# Patient Record
Sex: Female | Born: 1990 | Race: White | Hispanic: No | Marital: Married | Smoking: Current every day smoker
Health system: Southern US, Community
[De-identification: ages and names within clinical notes are randomized; demographics above are authoritative.]

## PROBLEM LIST (undated history)

## (undated) ENCOUNTER — Inpatient Hospital Stay (HOSPITAL_COMMUNITY): Payer: Self-pay

## (undated) DIAGNOSIS — Z789 Other specified health status: Secondary | ICD-10-CM

## (undated) HISTORY — PX: NO PAST SURGERIES: SHX2092

---
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
## 2011-04-06 LAB — HEPATIC FUNCTION PANEL
AST: 24
Bilirubin, Direct: 0.1
Total Bilirubin: 0.8

## 2011-04-06 LAB — PREGNANCY, URINE: Preg Test, Ur: NEGATIVE

## 2011-04-06 LAB — BENZODIAZEPINE, QUANTITATIVE, URINE
Alprazolam (GC/LC/MS), ur confirm: NEGATIVE
Flurazepam GC/MS Conf: NEGATIVE
Nordiazepam GC/MS Conf: NEGATIVE
Oxazepam GC/MS Conf: 170 ng/mL

## 2011-04-06 LAB — BASIC METABOLIC PANEL
BUN: 7
CO2: 24
Chloride: 103
Creatinine, Ser: 0.84

## 2011-04-06 LAB — DRUGS OF ABUSE SCREEN W/O ALC, ROUTINE URINE
Barbiturate Quant, Ur: NEGATIVE
Cocaine Metabolites: NEGATIVE
Opiate Screen, Urine: NEGATIVE
Phencyclidine (PCP): NEGATIVE
Propoxyphene: NEGATIVE

## 2011-04-06 LAB — URINALYSIS, ROUTINE W REFLEX MICROSCOPIC
Bilirubin Urine: NEGATIVE
Ketones, ur: NEGATIVE
Nitrite: NEGATIVE
Protein, ur: NEGATIVE
pH: 7

## 2011-04-06 LAB — SALICYLATE LEVEL: Salicylate Lvl: <4

## 2011-04-06 LAB — ETHANOL: Alcohol, Ethyl (B): <5

## 2011-04-07 LAB — HEPATIC FUNCTION PANEL
ALT: 16
AST: 25
Albumin: 5
Alkaline Phosphatase: 106
Indirect Bilirubin: 1 — ABNORMAL HIGH
Total Protein: 8.4 — ABNORMAL HIGH

## 2011-04-07 LAB — DIFFERENTIAL
Basophils Relative: 1
Lymphs Abs: 3.2
Monocytes Absolute: 0.6
Monocytes Relative: 6
Neutro Abs: 5.9

## 2011-04-07 LAB — CBC
Hemoglobin: 15.6 — ABNORMAL HIGH
MCHC: 35 — ABNORMAL HIGH
RBC: 5.09
WBC: 9.7

## 2011-04-07 LAB — URINE MICROSCOPIC-ADD ON

## 2011-04-07 LAB — DRUGS OF ABUSE SCREEN W/O ALC, ROUTINE URINE
Cocaine Metabolites: NEGATIVE
Phencyclidine (PCP): NEGATIVE
Propoxyphene: NEGATIVE

## 2011-04-07 LAB — BASIC METABOLIC PANEL
CO2: 27
Calcium: 10.1
Chloride: 98
Sodium: 136

## 2011-04-07 LAB — RPR: RPR Ser Ql: NONREACTIVE

## 2011-04-07 LAB — BENZODIAZEPINE, QUANTITATIVE, URINE
Alprazolam (GC/LC/MS), ur confirm: NEGATIVE
Nordiazepam GC/MS Conf: 550 ng/mL
Oxazepam GC/MS Conf: 1900 ng/mL

## 2011-04-07 LAB — URINALYSIS, ROUTINE W REFLEX MICROSCOPIC
Bilirubin Urine: NEGATIVE
Ketones, ur: NEGATIVE
Nitrite: NEGATIVE
Urobilinogen, UA: 0.2

## 2011-04-07 LAB — TSH: TSH: 1.5

## 2011-04-07 LAB — PREGNANCY, URINE: Preg Test, Ur: NEGATIVE

## 2016-02-20 ENCOUNTER — Ambulatory Visit (INDEPENDENT_AMBULATORY_CARE_PROVIDER_SITE_OTHER): Payer: BLUE CROSS/BLUE SHIELD | Admitting: Pediatrics

## 2016-02-20 ENCOUNTER — Encounter: Payer: Self-pay | Admitting: Pediatrics

## 2016-02-20 VITALS — BP 118/75 | HR 85 | Temp 97.5°F | Ht 66.0 in | Wt 151.0 lb

## 2016-02-20 DIAGNOSIS — Z0000 Encounter for general adult medical examination without abnormal findings: Secondary | ICD-10-CM | POA: Diagnosis not present

## 2016-02-20 DIAGNOSIS — Z720 Tobacco use: Secondary | ICD-10-CM

## 2016-02-20 DIAGNOSIS — O0000 Abdominal pregnancy without intrauterine pregnancy: Secondary | ICD-10-CM

## 2016-02-20 NOTE — Patient Instructions (Addendum)
Smoking Cessation, Tips for Success If you are ready to quit smoking, congratulations! You have chosen to help yourself be healthier. Cigarettes bring nicotine, tar, carbon monoxide, and other irritants into your body. Your lungs, heart, and blood vessels will be able to work better without these poisons. There are many different ways to quit smoking. Nicotine gum, nicotine patches, a nicotine inhaler, or nicotine nasal spray can help with physical craving. Hypnosis, support groups, and medicines help break the habit of smoking. WHAT THINGS CAN I DO TO MAKE QUITTING EASIER?  Here are some tips to help you quit for good:  Pick a date when you will quit smoking completely. Tell all of your friends and family about your plan to quit on that date.  Do not try to slowly cut down on the number of cigarettes you are smoking. Pick a quit date and quit smoking completely starting on that day.  Throw away all cigarettes.   Clean and remove all ashtrays from your home, work, and car.  On a card, write down your reasons for quitting. Carry the card with you and read it when you get the urge to smoke.  Cleanse your body of nicotine. Drink enough water and fluids to keep your urine clear or pale yellow. Do this after quitting to flush the nicotine from your body.  Learn to predict your moods. Do not let a bad situation be your excuse to have a cigarette. Some situations in your life might tempt you into wanting a cigarette.  Never have "just one" cigarette. It leads to wanting another and another. Remind yourself of your decision to quit.  Change habits associated with smoking. If you smoked while driving or when feeling stressed, try other activities to replace smoking. Stand up when drinking your coffee. Brush your teeth after eating. Sit in a different chair when you read the paper. Avoid alcohol while trying to quit, and try to drink fewer caffeinated beverages. Alcohol and caffeine may urge you to  smoke.  Avoid foods and drinks that can trigger a desire to smoke, such as sugary or spicy foods and alcohol.  Ask people who smoke not to smoke around you.  Have something planned to do right after eating or having a cup of coffee. For example, plan to take a walk or exercise.  Try a relaxation exercise to calm you down and decrease your stress. Remember, you may be tense and nervous for the first 2 weeks after you quit, but this will pass.  Find new activities to keep your hands busy. Play with a pen, coin, or rubber band. Doodle or draw things on paper.  Brush your teeth right after eating. This will help cut down on the craving for the taste of tobacco after meals. You can also try mouthwash.   Use oral substitutes in place of cigarettes. Try using lemon drops, carrots, cinnamon sticks, or chewing gum. Keep them handy so they are available when you have the urge to smoke.  When you have the urge to smoke, try deep breathing.  Designate your home as a nonsmoking area.  If you are a heavy smoker, ask your health care provider about a prescription for nicotine chewing gum. It can ease your withdrawal from nicotine.  Reward yourself. Set aside the cigarette money you save and buy yourself something nice.  Look for support from others. Join a support group or smoking cessation program. Ask someone at home or at work to help you with your plan   to quit smoking.  Always ask yourself, "Do I need this cigarette or is this just a reflex?" Tell yourself, "Today, I choose not to smoke," or "I do not want to smoke." You are reminding yourself of your decision to quit.  Do not replace cigarette smoking with electronic cigarettes (commonly called e-cigarettes). The safety of e-cigarettes is unknown, and some may contain harmful chemicals.  If you relapse, do not give up! Plan ahead and think about what you will do the next time you get the urge to smoke. HOW WILL I FEEL WHEN I QUIT SMOKING? You  may have symptoms of withdrawal because your body is used to nicotine (the addictive substance in cigarettes). You may crave cigarettes, be irritable, feel very hungry, cough often, get headaches, or have difficulty concentrating. The withdrawal symptoms are only temporary. They are strongest when you first quit but will go away within 10-14 days. When withdrawal symptoms occur, stay in control. Think about your reasons for quitting. Remind yourself that these are signs that your body is healing and getting used to being without cigarettes. Remember that withdrawal symptoms are easier to treat than the major diseases that smoking can cause.  Even after the withdrawal is over, expect periodic urges to smoke. However, these cravings are generally short lived and will go away whether you smoke or not. Do not smoke! WHAT RESOURCES ARE AVAILABLE TO HELP ME QUIT SMOKING? Your health care provider can direct you to community resources or hospitals for support, which may include:  Group support.  Education.  Hypnosis.  Therapy.   This information is not intended to replace advice given to you by your health care provider. Make sure you discuss any questions you have with your health care provider.   Document Released: 03/12/2004 Document Revised: 07/05/2014 Document Reviewed: 11/30/2012 Elsevier Interactive Patient Education 2016 [employer]vier Inc.  

## 2016-02-20 NOTE — Progress Notes (Signed)
  Subjective:   Patient ID: Westley HummerKayla B RICHARDSON, female    DOB: 07/25/1990, 25 y.o.   MRN: [id number] CC: Annual Exam (no pap) and Nicotine Dependence  HPI: Westley HummerKayla B RICHARDSON is a 25 y.o. female presenting for Annual Exam (no pap) and Nicotine Dependence  Smoking almost 2ppd Has 25yo, 25yo at home Not smoking in the house Wants to quit Has trie din the past Was going to ask for chantix but now pregnant [redacted]weeks pregnant Tried vape in the past  Not taking prenatal vitamin, made her feel sick Eating variety of fruits, veg Has appt with OB in 2 weeks Feeling well Did have some postpartum depression with first pregnancy Not with second  Mood has been fine recently  Relevant past medical, surgical, family and social history reviewed. Allergies and medications reviewed and updated. History  Smoking Status  . Current Every Day Smoker  . Packs/day: 1.50  . Years: 12.00  . Types: Cigarettes  Smokeless Tobacco  . Never Used    Comment: here today to discuss   ROS: Per HPI   Objective:    BP 118/75 (BP Location: Left Arm)   Pulse 85   Temp 97.5 F (36.4 C) (Oral)   Ht 5\' 6"  (1.676 m)   Wt 151 lb (68.5 kg)   LMP 01/13/2016 (Exact Date)   BMI 24.37 kg/m   Wt Readings from Last 3 Encounters:  02/20/16 151 lb (68.5 kg)    Gen: NAD, alert, cooperative with exam, NCAT EYES: EOMI, no conjunctival injection, or no icterus ENT:  TMs pearly gray b/l, OP without erythema LYMPH: no cervical LAD CV: NRRR, normal S1/S2, no murmur, distal pulses 2+ b/l Resp: CTABL, no wheezes, normal WOB Abd: +BS, soft, NTND. no guarding or organomegaly Ext: No edema, warm Neuro: Alert and oriented, strength equal b/l UE and LE, coordination grossly normal MSK: normal muscle bulk  Assessment & Plan:  Dorathy DaftKayla was seen today for annual exam and nicotine dependence.  Diagnoses and all orders for this visit:  Encounter for preventive health examination  Tobacco abuse Spent >10 minutes in discussion  of cessation strategies Gave hand out  Abdominal pregnancy, unspecified whether intrauterine pregnancy present Start folic acid Has appt with OB in 2 weeks  Follow up plan: As needed Rex Krasarol Judee Hennick, MD [facility]

## 2016-04-09 ENCOUNTER — Encounter (HOSPITAL_COMMUNITY): Payer: Self-pay

## 2016-04-13 ENCOUNTER — Other Ambulatory Visit: Payer: Self-pay

## 2016-07-30 ENCOUNTER — Other Ambulatory Visit: Payer: Self-pay

## 2016-08-16 ENCOUNTER — Encounter (HOSPITAL_COMMUNITY): Payer: Self-pay | Admitting: Unknown Physician Specialty

## 2016-08-16 ENCOUNTER — Other Ambulatory Visit (HOSPITAL_COMMUNITY): Payer: Self-pay | Admitting: Unknown Physician Specialty

## 2016-08-16 DIAGNOSIS — Z3689 Encounter for other specified antenatal screening: Secondary | ICD-10-CM

## 2016-08-16 DIAGNOSIS — IMO0001 Reserved for inherently not codable concepts without codable children: Secondary | ICD-10-CM

## 2016-08-16 DIAGNOSIS — Z3A32 32 weeks gestation of pregnancy: Secondary | ICD-10-CM

## 2016-08-24 ENCOUNTER — Encounter (HOSPITAL_COMMUNITY): Payer: Self-pay | Admitting: *Deleted

## 2016-08-26 ENCOUNTER — Ambulatory Visit (HOSPITAL_COMMUNITY)
Admission: RE | Admit: 2016-08-26 | Discharge: 2016-08-26 | Disposition: A | Discharge status: Home / Self Care | Payer: BLUE CROSS/BLUE SHIELD | Source: Ambulatory Visit | Attending: Unknown Physician Specialty | Admitting: Unknown Physician Specialty

## 2016-08-26 ENCOUNTER — Encounter (HOSPITAL_COMMUNITY): Payer: Self-pay

## 2016-08-26 ENCOUNTER — Ambulatory Visit (HOSPITAL_COMMUNITY): Admission: RE | Admit: 2016-08-26 | Payer: BLUE CROSS/BLUE SHIELD | Source: Ambulatory Visit

## 2016-08-26 ENCOUNTER — Other Ambulatory Visit (HOSPITAL_COMMUNITY): Payer: Self-pay | Admitting: Unknown Physician Specialty

## 2016-08-26 DIAGNOSIS — Z3689 Encounter for other specified antenatal screening: Secondary | ICD-10-CM | POA: Insufficient documentation

## 2016-08-26 DIAGNOSIS — IMO0001 Reserved for inherently not codable concepts without codable children: Secondary | ICD-10-CM

## 2016-08-26 DIAGNOSIS — Z3A32 32 weeks gestation of pregnancy: Secondary | ICD-10-CM | POA: Diagnosis not present

## 2016-08-26 HISTORY — DX: Other specified health status: Z78.9

## 2016-08-26 NOTE — Addendum Note (Signed)
Encounter addended by: Heidi DachMelanie A Davontae Prusinski, RN on: 08/26/2016 11:49 AM<BR>    Actions taken: Order list changed, Diagnosis association updated

## 2016-08-30 ENCOUNTER — Encounter (HOSPITAL_COMMUNITY): Payer: Self-pay

## 2016-08-30 ENCOUNTER — Other Ambulatory Visit (HOSPITAL_COMMUNITY): Payer: Self-pay | Admitting: Obstetrics and Gynecology

## 2016-08-30 ENCOUNTER — Ambulatory Visit (HOSPITAL_COMMUNITY)
Admission: RE | Admit: 2016-08-30 | Discharge: 2016-08-30 | Disposition: A | Discharge status: Home / Self Care | Payer: BLUE CROSS/BLUE SHIELD | Source: Ambulatory Visit | Attending: Unknown Physician Specialty | Admitting: Unknown Physician Specialty

## 2016-08-30 DIAGNOSIS — Z3A32 32 weeks gestation of pregnancy: Secondary | ICD-10-CM

## 2016-08-30 DIAGNOSIS — IMO0001 Reserved for inherently not codable concepts without codable children: Secondary | ICD-10-CM

## 2016-09-02 ENCOUNTER — Inpatient Hospital Stay (HOSPITAL_COMMUNITY)
Admission: AD | Admit: 2016-09-02 | Discharge: 2016-09-02 | Disposition: A | Discharge status: Home / Self Care | Payer: BLUE CROSS/BLUE SHIELD | Source: Ambulatory Visit | Attending: Obstetrics and Gynecology | Admitting: Obstetrics and Gynecology

## 2016-09-02 ENCOUNTER — Encounter (HOSPITAL_COMMUNITY): Payer: Self-pay

## 2016-09-02 ENCOUNTER — Ambulatory Visit (HOSPITAL_COMMUNITY)
Admission: RE | Admit: 2016-09-02 | Discharge: 2016-09-02 | Disposition: A | Discharge status: Home / Self Care | Payer: BLUE CROSS/BLUE SHIELD | Source: Ambulatory Visit | Attending: Unknown Physician Specialty | Admitting: Unknown Physician Specialty

## 2016-09-02 DIAGNOSIS — F17210 Nicotine dependence, cigarettes, uncomplicated: Secondary | ICD-10-CM | POA: Diagnosis not present

## 2016-09-02 DIAGNOSIS — Z3A33 33 weeks gestation of pregnancy: Secondary | ICD-10-CM | POA: Diagnosis not present

## 2016-09-02 DIAGNOSIS — IMO0001 Reserved for inherently not codable concepts without codable children: Secondary | ICD-10-CM

## 2016-09-02 DIAGNOSIS — O99333 Smoking (tobacco) complicating pregnancy, third trimester: Secondary | ICD-10-CM | POA: Diagnosis not present

## 2016-09-02 DIAGNOSIS — O368993 Maternal care for other specified fetal problems, unspecified trimester, fetus 3: Secondary | ICD-10-CM

## 2016-09-02 DIAGNOSIS — O2203 Varicose veins of lower extremity in pregnancy, third trimester: Secondary | ICD-10-CM | POA: Insufficient documentation

## 2016-09-02 DIAGNOSIS — Z3689 Encounter for other specified antenatal screening: Secondary | ICD-10-CM

## 2016-09-02 NOTE — MAU Provider Note (Signed)
History     CSN: [id number]  Arrival date and time: 09/02/16 1710   First Provider Initiated Contact with Patient 09/02/16 1741      Chief Complaint  Patient presents with  . Non-stress Test   HPI Christy Guerrero is a 26 y.o. G3P2002 at [redacted]w[redacted]d who presents for NST. Patient goes to [facility] for prenatal care. Is have twice weekly BPPs d/t fetal umbilical vein varix. Today's BPP was 6/8 (off for breathing) with a normal AFI. Pt was sent here for fetal monitoring to complete the BPP.  Denies abdominal pain, fever, vaginal bleeding, or LOF. Positive fetal movement.   OB History    Gravida Para Term Preterm AB Living   3 2 2     2    SAB TAB Ectopic Multiple Live Births                  Past Medical History:  Diagnosis Date  . Medical history non-contributory     Past Surgical History:  Procedure Laterality Date  . NO PAST SURGERIES      Family History  Problem Relation Age of Onset  . Drug abuse Mother   . Cancer Maternal Grandmother   . Cancer Maternal Grandfather   . Cancer Paternal Grandmother   . Cancer Paternal Grandfather     Social History  Substance Use Topics  . Smoking status: Current Every Day Smoker    Packs/day: 1.00    Years: 12.00    Types: Cigarettes  . Smokeless tobacco: Never Used     Comment: here today to discuss  . Alcohol use No    Allergies:  Allergies  Allergen Reactions  . Ceftriaxone Sodium In Dextrose     rochephin - allergic reaction    No prescriptions prior to admission.    Review of Systems  Constitutional: Negative.   Gastrointestinal: Negative.   Genitourinary: Negative.    Physical Exam   Blood pressure 116/70, pulse 88, temperature 98 F (36.7 C), temperature source Oral, resp. rate 18, last menstrual period 01/13/2016, SpO2 100 %.  Physical Exam  Nursing note and vitals reviewed. Constitutional: She is oriented to person, place, and time. She appears well-developed and well-nourished. No distress.  HENT:  Head:  Normocephalic and atraumatic.  Eyes: Conjunctivae are normal. Right eye exhibits no discharge. Left eye exhibits no discharge. No scleral icterus.  Neck: Normal range of motion.  Respiratory: Effort normal. No respiratory distress.  Neurological: She is alert and oriented to person, place, and time.  Skin: Skin is warm and dry. She is not diaphoretic.  Psychiatric: She has a normal mood and affect. Her behavior is normal. Judgment and thought content normal.   Fetal Tracing:  Baseline: 130 Variability: moderate Accelerations: 15x15 Decelerations: small variable at beginning of tracing  Toco: irr ctx -- pt doesn't feel MAU Course  Procedures No results found for this or any previous visit (from the past 24 hour(s)). [place] Mfm Fetal Bpp Wo Non Stress  Result Date: 09/02/2016 ----------------------------------------------------------------------  OBSTETRICS REPORT                      (Signed Final 09/02/2016 05:10 pm) ---------------------------------------------------------------------- Patient Info  ID #:       [id number]                         D.O.B.:   11/18/1990 (25 yrs)  Name:       Christy Guerrero  Visit Date:  09/02/2016 03:37 pm ---------------------------------------------------------------------- Performed By  Performed By:     Tomma Lightning             Ref. Address:     [facility]                    RDMS,RVT                                                             Center                                                             [address]                                                             [place], [place] [address]  Attending:        Clarene Critchley Whitecar        Location:         [facility]                    MD  Referred By:      Ruthy Dick                    MD ---------------------------------------------------------------------- Orders   #  Description                                 Code    1  [place] MFM FETAL BPP WO NON STRESS              [phone]  ----------------------------------------------------------------------   #  Ordered By               Order #        Accession #    Episode #   1  Charlsie Merles              [id number]       [id number]     [id number]  ---------------------------------------------------------------------- Indications   [redacted] weeks gestation of pregnancy                Z3A.33   Umbilical vein abnormality complicating        O69.9XX0   pregnancy (UVV)  ---------------------------------------------------------------------- OB History  Gravidity:    3         Term:   2        Prem:  0        SAB:   0  TOP:          0       Ectopic:  0        Living: 2 ---------------------------------------------------------------------- Fetal Evaluation  Num Of Fetuses:     1  Fetal Heart         135  Rate(bpm):  Cardiac Activity:   Observed  Presentation:       Cephalic  Amniotic Fluid  AFI FV:      Subjectively within normal limits  AFI Sum(cm)     %Tile       Largest Pocket(cm)  15.54           56          4.75  RUQ(cm)       RLQ(cm)       LUQ(cm)        LLQ(cm)  4.75          4.19          4.59           2.01 ---------------------------------------------------------------------- Biophysical Evaluation  Amniotic F.V:   Within normal limits       F. Tone:        Observed  F. Movement:    Observed                   Score:          6/8  F. Breathing:   Not Observed ---------------------------------------------------------------------- Gestational Age  LMP:           33w 2d       Date:   01/13/16                 EDD:   10/19/16  Best:          33w 2d    Det. By:   LMP  (01/13/16)          EDD:   10/19/16 ---------------------------------------------------------------------- Anatomy  Cranium:               Previously seen        Aortic Arch:            Appears normal  Cavum:                 Previously seen        Ductal Arch:            Not well visualized  Ventricles:            Previously seen         Diaphragm:              Appears normal  Choroid Plexus:        Previously seen        Stomach:                Appears normal, left                                                                        sided  Cerebellum:            Previously seen  Abdomen:                Umbilical vein                                                                        varix  Posterior Fossa:       Previously seen        Abdominal Wall:         Previously seen  Nuchal Fold:           Not applicable (>20    Cord Vessels:           Previously seen                         wks GA)  Face:                  Orbits and profile     Kidneys:                Appear normal                         previously seen  Lips:                  Previously seen        Bladder:                Appears normal  Thoracic:              Previously seen        Spine:                  Previously seen  Heart:                 Appears normal         Upper Extremities:      Previously seen                         (4CH, axis, and                         situs)  RVOT:                  Appears normal         Lower Extremities:      Previously seen  LVOT:                  Appears normal  Other:  Fetus appears to be a female. Nasal bone previously visualized.          Open hands previously visualized. Technically difficult due to          advanced GA and fetal position. ---------------------------------------------------------------------- Cervix Uterus Adnexa  Cervix  Normal appearance by transabdominal scan.  Uterus  No abnormality visualized.  Left Ovary  Not visualized.  Right Ovary  Not visualized. ---------------------------------------------------------------------- Impression  Single IUP at 33w 2d  Follow up due to Umbilical vein varix  The UVV measures 1.8 cm in diameter (unchanged / stable)  No filling defects noted on color flow  BPP 6/8 (-2  for absent breathing)  Normal amniotic fluid volume  ---------------------------------------------------------------------- Recommendations  Patient was sent to MAU for NST - if reactive, may continue  outpatient follow up.  Recommend continued 2x weekly BPPs with color flow  evaluation of the umbilical vein varix  Delivery at 37 weeks in the absence of other complications ----------------------------------------------------------------------                Candis ShinePaul W Whitecar, MD Electronically Signed Final Report   09/02/2016 05:10 pm ----------------------------------------------------------------------   MDM Reactive fetal tracing Fetal monitoring reviewed by Dr. Alysia PennaErvin BPP 8/10  Assessment and Plan  A: 1. NST (non-stress test) reactive   2. Pregnancy complicated by umbilical cord varix, antepartum, single or unspecified fetus    P: Discharge home Fetal kick counts Go to OB as scheduled & return to MFM as scheduled  Judeth Hornrin Shalea Tomczak 09/02/2016, 5:40 PM

## 2016-09-02 NOTE — Discharge Instructions (Signed)
Fetal Movement Counts  Patient Name: ________________________________________________ Patient Due Date: ____________________  What is a fetal movement count?  A fetal movement count is the number of times that you feel your baby move during a certain amount of time. This may also be called a fetal kick count. A fetal movement count is recommended for every pregnant woman. You may be asked to start counting fetal movements as early as week 28 of your pregnancy.  Pay attention to when your baby is most active. You may notice your baby's sleep and wake cycles. You may also notice things that make your baby move more. You should do a fetal movement count:  · When your baby is normally most active.  · At the same time each day.    A good time to count movements is while you are resting, after having something to eat and drink.  How do I count fetal movements?  1. Find a quiet, comfortable area. Sit, or lie down on your side.  2. Write down the date, the start time and stop time, and the number of movements that you felt between those two times. Take this information with you to your health care visits.  3. For 2 hours, count kicks, flutters, swishes, rolls, and jabs. You should feel at least 10 movements during 2 hours.  4. You may stop counting after you have felt 10 movements.  5. If you do not feel 10 movements in 2 hours, have something to eat and drink. Then, keep resting and counting for 1 hour. If you feel at least 4 movements during that hour, you may stop counting.  Contact a health care provider if:  · You feel fewer than 4 movements in 2 hours.  · Your baby is not moving like he or she usually does.  Date: ____________ Start time: ____________ Stop time: ____________ Movements: ____________  Date: ____________ Start time: ____________ Stop time: ____________ Movements: ____________  Date: ____________ Start time: ____________ Stop time: ____________ Movements: ____________  Date: ____________ Start time:  ____________ Stop time: ____________ Movements: ____________  Date: ____________ Start time: ____________ Stop time: ____________ Movements: ____________  Date: ____________ Start time: ____________ Stop time: ____________ Movements: ____________  Date: ____________ Start time: ____________ Stop time: ____________ Movements: ____________  Date: ____________ Start time: ____________ Stop time: ____________ Movements: ____________  Date: ____________ Start time: ____________ Stop time: ____________ Movements: ____________  This information is not intended to replace advice given to you by your health care provider. Make sure you discuss any questions you have with your health care provider.  Document Released: 07/14/2006 Document Revised: 02/11/2016 Document Reviewed: 07/24/2015  Elsevier Interactive Patient Education © 2017 [employer]

## 2016-09-06 ENCOUNTER — Ambulatory Visit (HOSPITAL_COMMUNITY)
Admission: RE | Admit: 2016-09-06 | Discharge: 2016-09-06 | Disposition: A | Discharge status: Home / Self Care | Payer: BLUE CROSS/BLUE SHIELD | Source: Ambulatory Visit | Attending: Unknown Physician Specialty | Admitting: Unknown Physician Specialty

## 2016-09-06 ENCOUNTER — Encounter (HOSPITAL_COMMUNITY): Payer: Self-pay

## 2016-09-06 DIAGNOSIS — IMO0001 Reserved for inherently not codable concepts without codable children: Secondary | ICD-10-CM

## 2016-09-06 DIAGNOSIS — Z3A33 33 weeks gestation of pregnancy: Secondary | ICD-10-CM | POA: Insufficient documentation

## 2016-09-09 ENCOUNTER — Encounter (HOSPITAL_COMMUNITY): Payer: Self-pay

## 2016-09-09 ENCOUNTER — Ambulatory Visit (HOSPITAL_COMMUNITY)
Admission: RE | Admit: 2016-09-09 | Discharge: 2016-09-09 | Disposition: A | Discharge status: Home / Self Care | Payer: BLUE CROSS/BLUE SHIELD | Source: Ambulatory Visit | Attending: Unknown Physician Specialty | Admitting: Unknown Physician Specialty

## 2016-09-09 DIAGNOSIS — IMO0001 Reserved for inherently not codable concepts without codable children: Secondary | ICD-10-CM

## 2016-09-09 DIAGNOSIS — Z3A34 34 weeks gestation of pregnancy: Secondary | ICD-10-CM | POA: Diagnosis not present

## 2016-09-13 ENCOUNTER — Encounter (HOSPITAL_COMMUNITY): Payer: Self-pay

## 2016-09-13 ENCOUNTER — Ambulatory Visit (HOSPITAL_COMMUNITY)
Admission: RE | Admit: 2016-09-13 | Discharge: 2016-09-13 | Disposition: A | Discharge status: Home / Self Care | Payer: BLUE CROSS/BLUE SHIELD | Source: Ambulatory Visit | Attending: Unknown Physician Specialty | Admitting: Unknown Physician Specialty

## 2016-09-13 DIAGNOSIS — Z3A34 34 weeks gestation of pregnancy: Secondary | ICD-10-CM | POA: Insufficient documentation

## 2016-09-13 DIAGNOSIS — IMO0001 Reserved for inherently not codable concepts without codable children: Secondary | ICD-10-CM

## 2016-09-16 ENCOUNTER — Encounter (HOSPITAL_COMMUNITY): Payer: Self-pay

## 2016-09-16 ENCOUNTER — Other Ambulatory Visit (HOSPITAL_COMMUNITY): Payer: Self-pay | Admitting: *Deleted

## 2016-09-16 ENCOUNTER — Ambulatory Visit (HOSPITAL_COMMUNITY)
Admission: RE | Admit: 2016-09-16 | Discharge: 2016-09-16 | Disposition: A | Discharge status: Home / Self Care | Payer: BLUE CROSS/BLUE SHIELD | Source: Ambulatory Visit | Attending: Unknown Physician Specialty | Admitting: Unknown Physician Specialty

## 2016-09-16 DIAGNOSIS — Z3A35 35 weeks gestation of pregnancy: Secondary | ICD-10-CM | POA: Insufficient documentation

## 2016-09-16 DIAGNOSIS — O365920 Maternal care for other known or suspected poor fetal growth, second trimester, not applicable or unspecified: Secondary | ICD-10-CM

## 2016-09-16 DIAGNOSIS — IMO0001 Reserved for inherently not codable concepts without codable children: Secondary | ICD-10-CM

## 2016-09-20 ENCOUNTER — Encounter (HOSPITAL_COMMUNITY): Payer: Self-pay

## 2016-09-20 ENCOUNTER — Ambulatory Visit (HOSPITAL_COMMUNITY)
Admission: RE | Admit: 2016-09-20 | Discharge: 2016-09-20 | Disposition: A | Discharge status: Home / Self Care | Payer: BLUE CROSS/BLUE SHIELD | Source: Ambulatory Visit | Attending: Unknown Physician Specialty | Admitting: Unknown Physician Specialty

## 2016-09-20 DIAGNOSIS — IMO0001 Reserved for inherently not codable concepts without codable children: Secondary | ICD-10-CM

## 2016-09-20 DIAGNOSIS — Z3A35 35 weeks gestation of pregnancy: Secondary | ICD-10-CM | POA: Insufficient documentation

## 2016-09-22 ENCOUNTER — Encounter (HOSPITAL_COMMUNITY): Payer: Self-pay

## 2016-09-22 ENCOUNTER — Ambulatory Visit (HOSPITAL_COMMUNITY)
Admission: RE | Admit: 2016-09-22 | Discharge: 2016-09-22 | Disposition: A | Discharge status: Home / Self Care | Payer: BLUE CROSS/BLUE SHIELD | Source: Ambulatory Visit | Attending: Unknown Physician Specialty | Admitting: Unknown Physician Specialty

## 2016-09-22 ENCOUNTER — Other Ambulatory Visit (HOSPITAL_COMMUNITY): Payer: Self-pay | Admitting: Maternal and Fetal Medicine

## 2016-09-22 ENCOUNTER — Other Ambulatory Visit (HOSPITAL_COMMUNITY): Payer: Self-pay | Admitting: Obstetrics and Gynecology

## 2016-09-22 DIAGNOSIS — Z3A36 36 weeks gestation of pregnancy: Secondary | ICD-10-CM | POA: Diagnosis not present

## 2016-09-22 DIAGNOSIS — IMO0001 Reserved for inherently not codable concepts without codable children: Secondary | ICD-10-CM

## 2016-09-22 DIAGNOSIS — O365920 Maternal care for other known or suspected poor fetal growth, second trimester, not applicable or unspecified: Secondary | ICD-10-CM

## 2016-09-23 ENCOUNTER — Ambulatory Visit (HOSPITAL_COMMUNITY): Payer: BLUE CROSS/BLUE SHIELD

## 2016-09-23 ENCOUNTER — Other Ambulatory Visit (HOSPITAL_COMMUNITY): Payer: Self-pay | Admitting: *Deleted

## 2016-09-23 ENCOUNTER — Encounter (HOSPITAL_COMMUNITY): Payer: Self-pay

## 2016-09-23 DIAGNOSIS — O358XX0 Maternal care for other (suspected) fetal abnormality and damage, not applicable or unspecified: Secondary | ICD-10-CM

## 2016-09-27 ENCOUNTER — Ambulatory Visit (HOSPITAL_COMMUNITY): Admission: RE | Admit: 2016-09-27 | Payer: BLUE CROSS/BLUE SHIELD | Source: Ambulatory Visit

## 2016-09-30 ENCOUNTER — Ambulatory Visit (HOSPITAL_COMMUNITY): Payer: BLUE CROSS/BLUE SHIELD

## 2017-05-02 ENCOUNTER — Encounter (HOSPITAL_COMMUNITY): Payer: Self-pay

## 2019-02-17 IMAGING — US US MFM FETAL BPP W/O NON-STRESS
1 series · 12 of 28 positions shown · non-contrast
Comparison: none

[Series 1: us mfm fetal bpp w/o non-stress · 30 acquisitions, 12 frames shown]
[im 2/30]
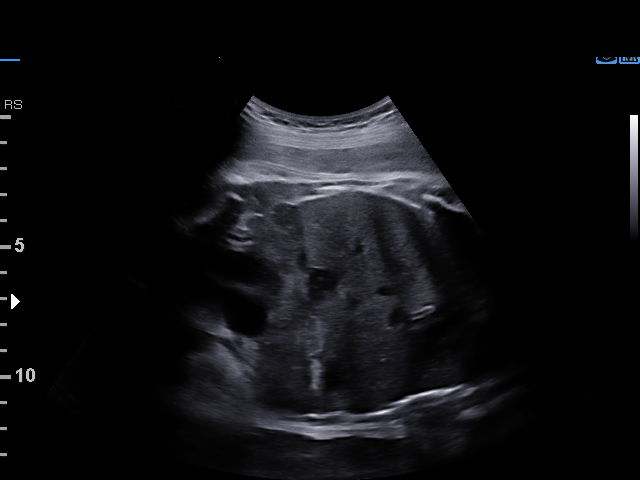
[im 4/30]
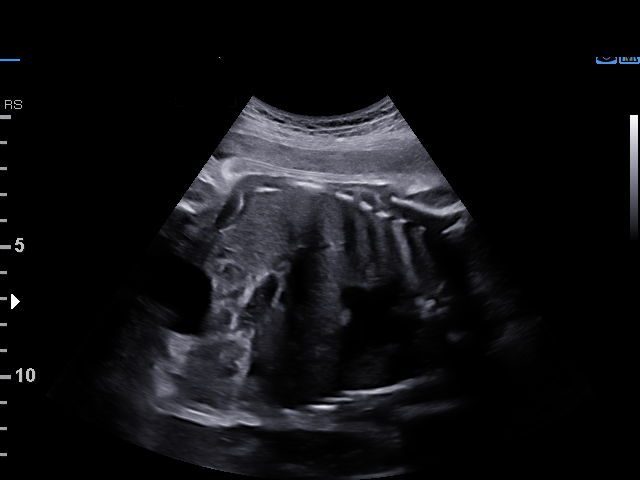
[im 6/30]
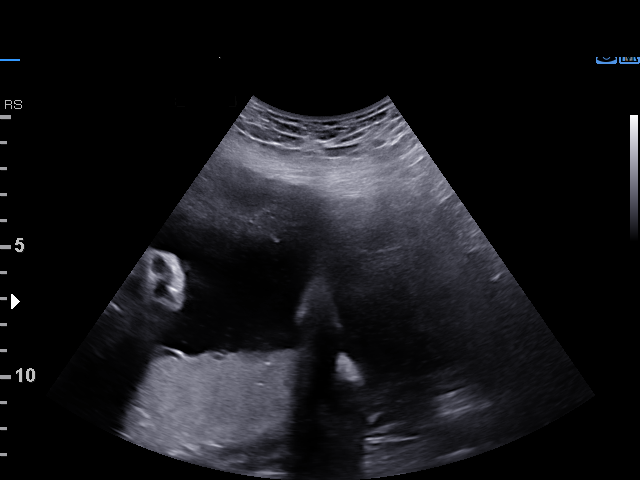
[im 9/30]
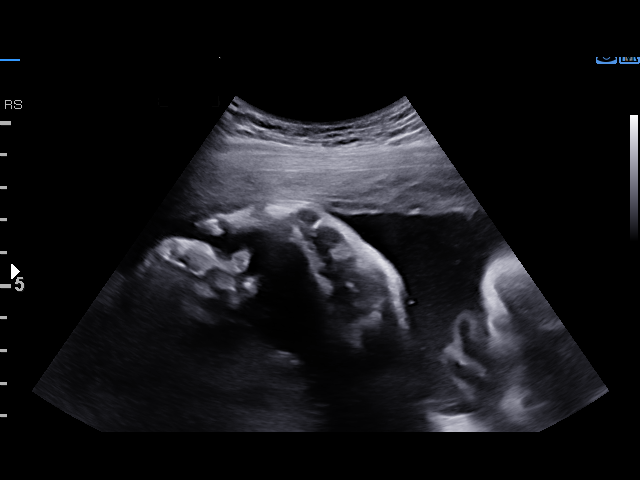
[im 11/30]
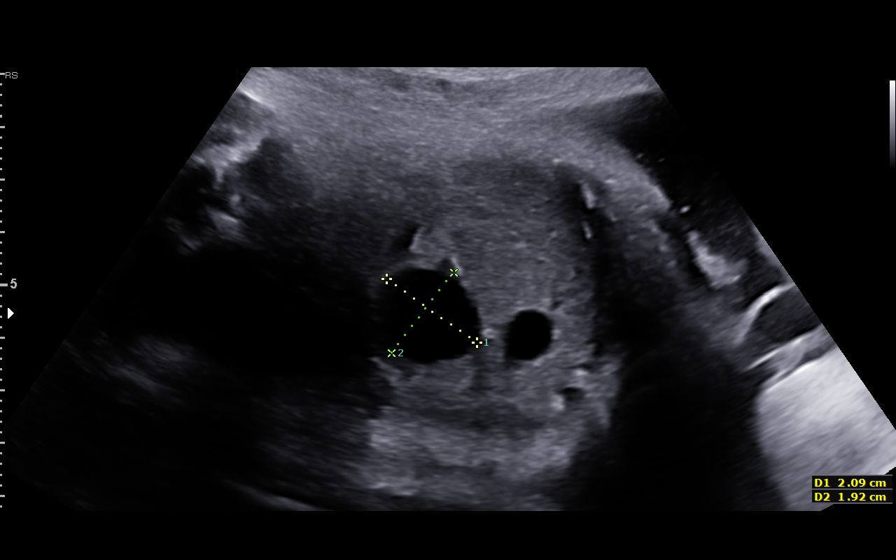
[im 13/30]
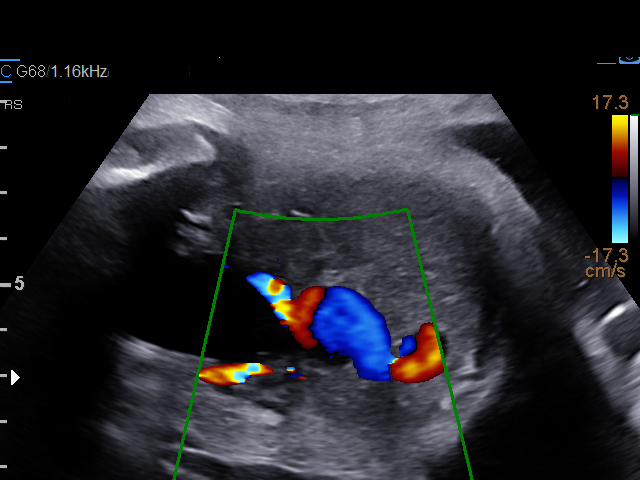
[im 17/30]
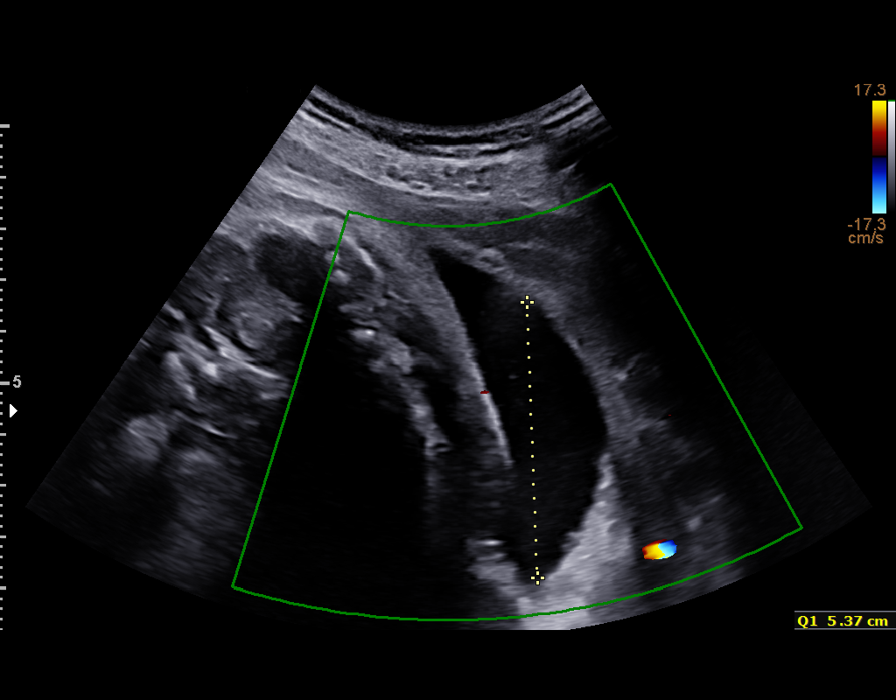
[im 19/30]
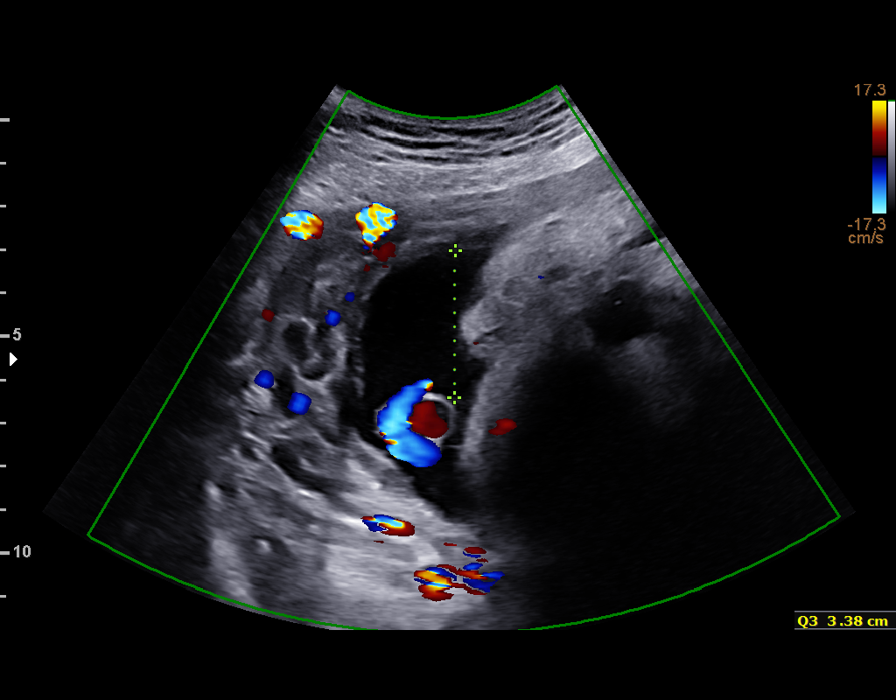
[im 21/30]
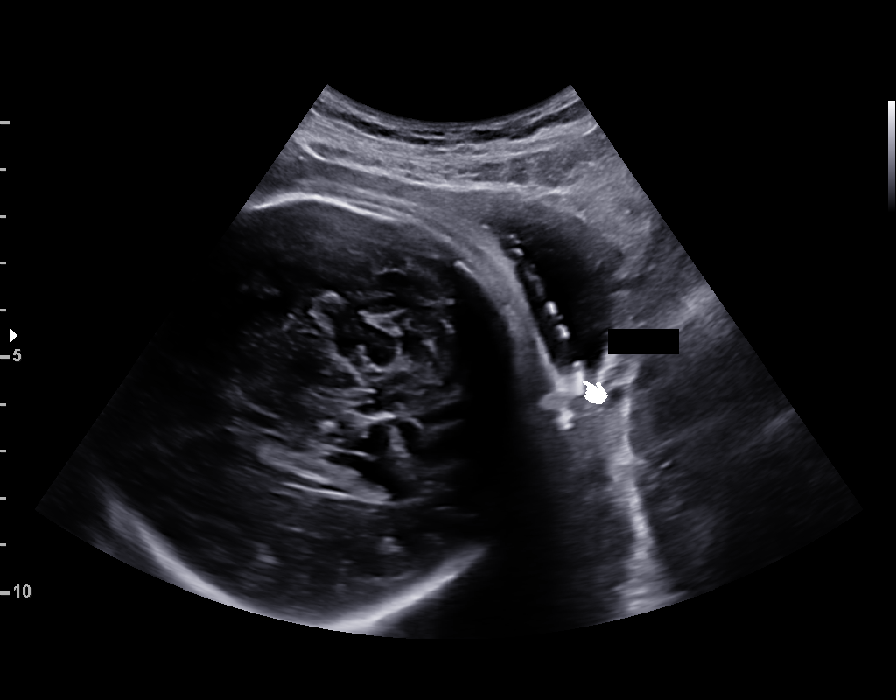
[im 24/30]
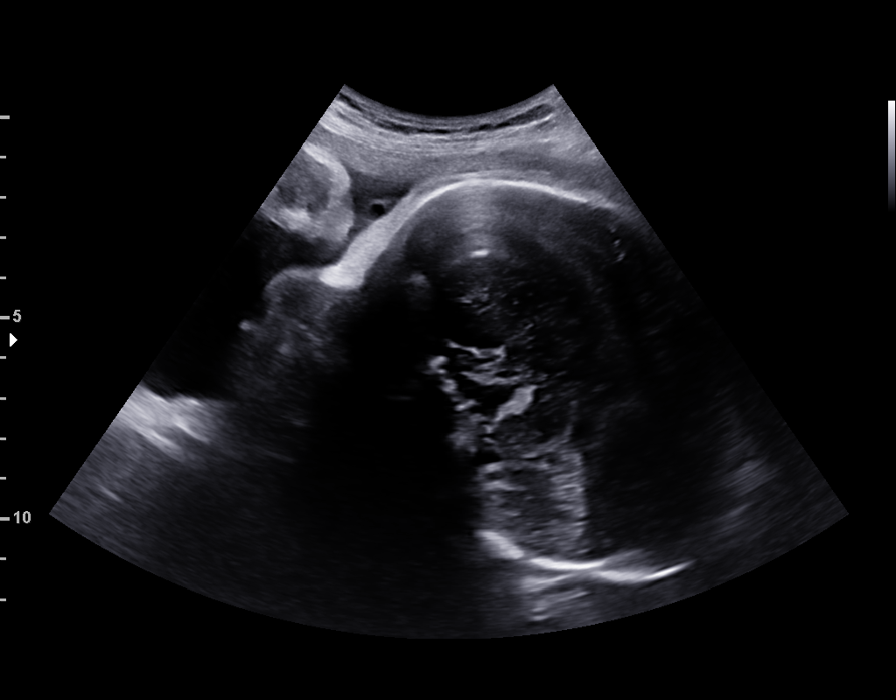
[im 26/30]
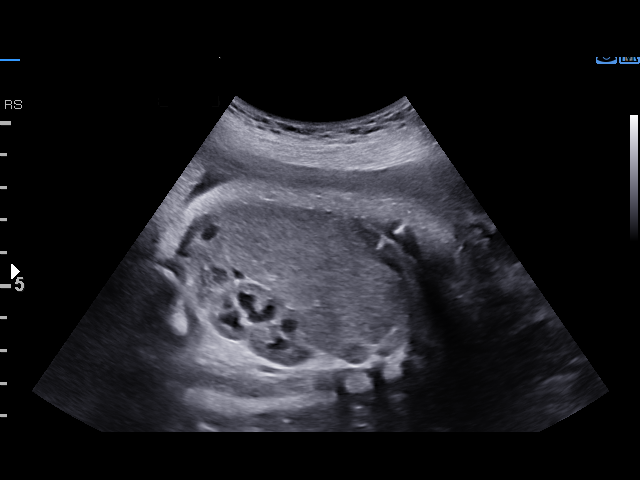
[im 28/30]
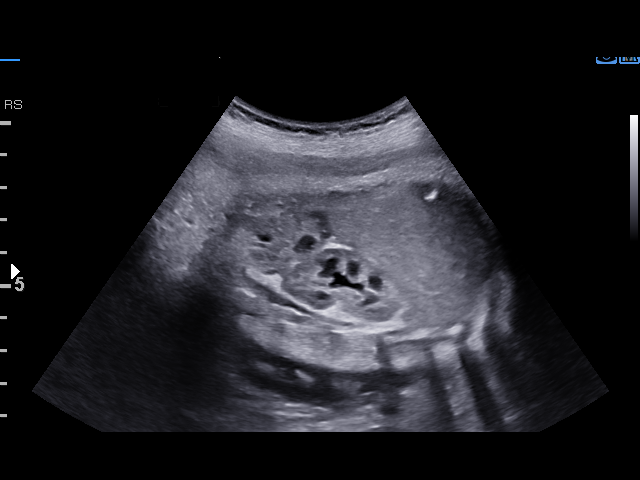

[12 of 28 positions shown; findings below may reference images not displayed]

Center
[REDACTED] [id number]

1  Puuraken Napp              [id number]       [id number]     [id number]
Indications

35 weeks gestation of pregnancy
Umbilical vein abnormality complicating
pregnancy (UVV)
OB History

Blood Type:            Height:  5'6"   Weight (lb):  172      BMI:
Gravidity:    3         Term:   2        Prem:   0        SAB:   0
TOP:          0       Ectopic:  0        Living: 2
Fetal Evaluation

Num Of Fetuses:     1
Fetal Heart         136
Rate(bpm):
Cardiac Activity:   Observed
Presentation:       Cephalic

Amniotic Fluid
AFI FV:      Subjectively within normal limits

AFI Sum(cm)     %Tile       Largest Pocket(cm)
17.71           66

RUQ(cm)       RLQ(cm)       LUQ(cm)        LLQ(cm)
5.37
Biophysical Evaluation

Amniotic F.V:   Pocket => 2 cm two         F. Tone:        Observed
planes
F. Movement:    Observed                   Score:          [DATE]
F. Breathing:   Observed
Gestational Age

LMP:           35w 6d       Date:   01/13/16                 EDD:   10/19/16
Best:          35w 6d    Det. By:   LMP  (01/13/16)          EDD:   10/19/16
Impression

IUP at 35+6 weeks, with UVV and possible micrognathia
BPP [DATE]
UVV essentially unchanged with no turbulent flow
Recommendations

Will recheck [REDACTED] due to conflict with another
appointment on [REDACTED]

## 2020-09-03 ENCOUNTER — Other Ambulatory Visit: Payer: Self-pay | Admitting: Family Medicine

## 2020-09-03 ENCOUNTER — Other Ambulatory Visit: Payer: Self-pay

## 2020-09-03 ENCOUNTER — Ambulatory Visit: Payer: Self-pay

## 2020-09-03 DIAGNOSIS — M79671 Pain in right foot: Secondary | ICD-10-CM

## 2021-02-12 ENCOUNTER — Other Ambulatory Visit: Payer: Self-pay

## 2021-02-12 ENCOUNTER — Ambulatory Visit (HOSPITAL_COMMUNITY): Payer: Medicaid Other | Attending: Orthopedic Surgery | Admitting: Physical Therapy

## 2021-02-12 DIAGNOSIS — M25562 Pain in left knee: Secondary | ICD-10-CM | POA: Insufficient documentation

## 2021-02-12 DIAGNOSIS — M6281 Muscle weakness (generalized): Secondary | ICD-10-CM | POA: Insufficient documentation

## 2021-02-12 NOTE — Addendum Note (Signed)
Addended by: Bella Kennedy on: 02/12/2021 03:43 PM   Modules accepted: Orders

## 2021-02-12 NOTE — Therapy (Addendum)
[facility] [address] [place], [place], [address] Phone: [phone]   Fax:  [phone]  Physical Therapy Evaluation  Patient Details  Name: Christy Guerrero MRN: [id number] Date of Birth: 20-Jan-1991 Referring Provider (PT): Christy Guerrero   Encounter Date: 02/12/2021   PT End of Session - 02/12/21 1351     Visit Number 1    Number of Visits 12    Date for PT Re-Evaluation 03/27/21    Authorization Type healthy blue auth put in has been seen in [place]    Progress Note Due on Visit 10    PT Start Time 1325    PT Stop Time 1355    PT Time Calculation (min) 30 min             Past Medical History:  Diagnosis Date   Medical history non-contributory     Past Surgical History:  Procedure Laterality Date   NO PAST SURGERIES      There were no vitals filed for this visit.    Subjective Assessment - 02/12/21 1323     Subjective Christy Guerrero is a 30 yo female who has had Lt knee surgery June 15th.  She is no longer needing to use a knee brace.  She has been having therapy in [place] approximately 9 visits.  She is still having pain on the medial area and her knee kind of locks with motion    Patient Stated Goals Less pain, going up steps and hills easier    Currently in Pain? Yes    Pain Score 0-No pain   highest pain in the past week is a 4/10   Pain Location Knee    Pain Orientation Left    Pain Descriptors / Indicators Aching;Tightness    Pain Type Acute pain    Pain Onset More than a month ago    Pain Frequency Intermittent    Aggravating Factors  activity                [facility] PT Assessment - 02/12/21 0001       Assessment   Medical Diagnosis LT knee surgery   ACL/menicus; auto graft repair   Referring Provider (PT) Christy Guerrero    Onset Date/Surgical Date 12/10/20    Next MD Visit 02/27/2021      Precautions   Precautions None      Restrictions   Weight Bearing Restrictions No      Balance Screen   Has the  patient fallen in the past 6 months Yes    How many times? 1    Has the patient had a decrease in activity level because of a fear of falling?  Yes      Prior Function   Level of Independence Independent    Vocation Full time employment    Vocation Requirements climb ladder, walk job sites, running conduit      Cognition   Overall Cognitive Status Within Functional Limits for tasks assessed      ROM / Strength   AROM / PROM / Strength AROM;Strength      AROM   AROM Assessment Site Knee    Right/Left Knee Left    Left Knee Extension 2    Left Knee Flexion 125      Strength   Strength Assessment Site Hip;Knee;Ankle    Right/Left Hip Right;Left    Right Hip Flexion 5/5    Right Hip Extension 5/5    Right Hip  ABduction 5/5    Left Hip Flexion 4/5    Left Hip Extension 3/5    Left Hip ABduction 4-/5    Right/Left Knee Right;Left    Right Knee Extension 5/5    Left Knee Extension 4/5                        Objective measurements completed on examination: See above findings.       [facility] Adult PT Treatment/Exercise - 02/12/21 0001       Exercises   Exercises Knee/Hip      Knee/Hip Exercises: Standing   Functional Squat 10 reps      Knee/Hip Exercises: Seated   Sit to Sand 10 reps      Knee/Hip Exercises: Sidelying   Hip ADduction Strengthening;Left;10 reps      Knee/Hip Exercises: Prone   Hamstring Curl 10 reps    Hip Extension 10 reps                    PT Education - 02/12/21 1351     Education Details HEP    Person(s) Educated Patient    Methods Explanation    Comprehension Verbalized understanding              PT Short Term Goals - 02/12/21 1401       PT SHORT TERM GOAL #1   Title PT to be I in HEP in order to improve hip and knee strength to allow pt to be able to arise from a low lying couch without difficulty    Time 2    Period Weeks    Status New    Target Date 02/27/21               PT Long Term  Goals - 02/12/21 1402       PT LONG TERM GOAL #1   Title PT Lt LE strength to improve one grade to allow pt to go up and down 15 steps in a reciprocal manner without the use of handrails    Time 4    Period Weeks    Status New    Target Date 03/13/21      PT LONG TERM GOAL #2   Title PT Lt knee pain to be no greater than a 2/10 to allow pt to complete a full day at work without difficulty.    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  She had been being seen in [place] but did not like how her therapy was going therefore she switched to this facility.  Christy Guerrero has a very physical job and needs to be at least 80% to RTW.  Current evaluation demonastrates decreased knee and hip strength increased knee pain which is limiting patients functional ability. She will benefit from skilled PT  to address her deficits and improve her functional mobility.    Examination-Activity Limitations Lift;Locomotion Level;Stairs;Squat    Examination-Participation Restrictions Cleaning;Community Activity;Occupation;Yard Work    Stability/Clinical Decision Making Stable/Uncomplicated    Clinical Decision Making Moderate    Rehab Potential Good    PT Frequency 2x / week    PT Duration 4 weeks    PT Treatment/Interventions Patient/family education;Manual techniques;Balance training;Therapeutic exercise;Neuromuscular re-education;Therapeutic activities    PT Next Visit Plan Progress functional strengthening pt is at 9 weeks post op :  kneeextension 90-40 degrees, hamstring curls, lateral step overs , lateral lunges  , lateal step up , staep downs, wall squats, leg press , bosu squatting    PT Home Exercise Plan Evaluation: squat, sit to stand, SL hip abduction, prone knee flexion and hip extension             Patient will benefit from skilled therapeutic intervention in order to improve the following deficits and impairments:  Decreased activity tolerance, Decreased strength, Pain  Visit Diagnosis: Acute pain of left knee  Muscle weakness (generalized)     Problem List There are no problems to display for this patient.  Virgina Organ, PT CLT [phone]  02/12/2021, 2:07 PM  [facility] [address] [place], [place], [address] Phone: [phone]   Fax:  [phone]  Name: Christy Guerrero MRN: [id number] Date of Birth: [birth year]

## 2021-02-16 ENCOUNTER — Other Ambulatory Visit: Payer: Self-pay

## 2021-02-16 ENCOUNTER — Ambulatory Visit (HOSPITAL_COMMUNITY): Payer: Medicaid Other | Admitting: Physical Therapy

## 2021-02-16 DIAGNOSIS — M25562 Pain in left knee: Secondary | ICD-10-CM

## 2021-02-16 DIAGNOSIS — M6281 Muscle weakness (generalized): Secondary | ICD-10-CM

## 2021-02-16 NOTE — Therapy (Signed)
[facility] [address] [place], [place], [address] Phone: [phone]   Fax:  [phone]  Physical Therapy Treatment  Patient Details  Name: Christy Guerrero MRN: [id number] Date of Birth: October 09, 1990 Referring Provider (PT): Duwayne Heck   Encounter Date: 02/16/2021   PT End of Session - 02/16/21 0844     Visit Number 2    Number of Visits 12    Date for PT Re-Evaluation 03/27/21    Authorization Type healthy blue auth put in has been seen in [place]    Authorization - Visit Number 12    Authorization - Number of Visits 27    Progress Note Due on Visit 10    PT Start Time 0825    PT Stop Time 0905    PT Time Calculation (min) 40 min    Activity Tolerance Patient tolerated treatment well    Behavior During Therapy [facility] for tasks assessed/performed             Past Medical History:  Diagnosis Date   Medical history non-contributory     Past Surgical History:  Procedure Laterality Date   NO PAST SURGERIES      There were no vitals filed for this visit.   Subjective Assessment - 02/16/21 0824     Subjective Pt states that her knee is sore.  She is doing well with her exercises.    Patient Stated Goals Less pain, going up steps and hills easier    Currently in Pain? Yes    Pain Score 3     Pain Location Knee    Pain Orientation Left    Pain Descriptors / Indicators Aching;Tightness    Pain Type Acute pain    Pain Onset More than a month ago    Pain Frequency Intermittent    Aggravating Factors  activity                               [facility] Adult PT Treatment/Exercise - 02/16/21 0001       Exercises   Exercises Knee/Hip      Knee/Hip Exercises: Stretches   Passive Hamstring Stretch Left;3 reps;30 seconds      Knee/Hip Exercises: Aerobic   Nustep Level 3 hills x 10'      Knee/Hip Exercises: Standing   Heel Raises Left;15 reps    Knee Flexion Strengthening;Left;10 reps;Limitations    Knee  Flexion Limitations 3#    Forward Lunges Both;10 reps    Terminal Knee Extension Left;15 reps    Lateral Step Up Left;10 reps;Step Height: 2";Step Height: 6"    Forward Step Up Left;10 reps;Hand Hold: 2;Step Height: 6"    Functional Squat 15 reps    SLS 60"    SLS with Vectors 15" x 3      Knee/Hip Exercises: Seated   Sit to Sand 15 reps                      PT Short Term Goals - 02/16/21 0847       PT SHORT TERM GOAL #1   Title PT to be I in HEP in order to improve hip and knee strength to allow pt to be able to arise from a low lying couch without difficulty    Time 2    Period Weeks    Status On-going    Target Date 02/27/21  PT Long Term Goals - 02/16/21 0848       PT LONG TERM GOAL #1   Title PT Lt LE strength to improve one grade to allow pt to go up and down 15 steps in a reciprocal manner without the use of handrails    Time 4    Period Weeks    Status On-going      PT LONG TERM GOAL #2   Title PT Lt knee pain to be no greater than a 2/10 to allow pt to complete a full day at work without difficulty.    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  Progressed pt thru exercise program with minimal vebal cuing needed for proper technique.  Pt has a motorcycle she would like to be able to ride will continue to work on improving ROM as she does not feel that she can get her foot back far enought to kick start her bike.    Examination-Activity Limitations Lift;Locomotion Level;Stairs;Squat    Examination-Participation Restrictions Cleaning;Community Activity;Occupation;Yard Work    Stability/Clinical Decision Making  Stable/Uncomplicated    Clinical Decision Making Moderate    Rehab Potential Good    PT Frequency 2x / week    PT Treatment/Interventions Patient/family education;Manual techniques;Balance training;Therapeutic exercise;Neuromuscular re-education;Therapeutic activities    PT Next Visit Plan Progress functional strengthening pt is at 9 weeks post op; begin side stepping with t band and step downs    PT Home Exercise Plan Evaluation: squat, sit to stand, SL hip abduction, prone knee flexion and hip extension             Patient will benefit from skilled therapeutic intervention in order to improve the following deficits and impairments:  Decreased activity tolerance, Decreased strength, Pain  Visit Diagnosis: Acute pain of left knee  Muscle weakness (generalized)     Problem List There are no problems to display for this patient.  Virgina Organ, PT CLT [phone]  02/16/2021, 9:13 AM  [facility] [address] [place], [place], [address] Phone: [phone]   Fax:  [phone]  Name: Christy Guerrero MRN: [id number] Date of Birth: Nov 17, 1990

## 2021-02-20 ENCOUNTER — Encounter (HOSPITAL_COMMUNITY): Payer: Self-pay

## 2021-02-20 ENCOUNTER — Other Ambulatory Visit: Payer: Self-pay

## 2021-02-20 ENCOUNTER — Ambulatory Visit (HOSPITAL_COMMUNITY): Payer: Medicaid Other

## 2021-02-20 DIAGNOSIS — M25562 Pain in left knee: Secondary | ICD-10-CM

## 2021-02-20 DIAGNOSIS — M6281 Muscle weakness (generalized): Secondary | ICD-10-CM

## 2021-02-20 NOTE — Therapy (Signed)
[facility] [address] [place], [place], [address] Phone: [phone]   Fax:  [phone]  Physical Therapy Treatment  Patient Details  Name: Christy Guerrero MRN: [id number] Date of Birth: 03-02-1991 Referring Provider (PT): Duwayne Heck   Encounter Date: 02/20/2021   PT End of Session - 02/20/21 0834     Visit Number 3    Number of Visits 12    Date for PT Re-Evaluation 03/27/21    Authorization Type healthy blue auth put in has been seen in [place]    Authorization - Visit Number 13    Authorization - Number of Visits 27    Progress Note Due on Visit 10    PT Start Time 0828    PT Stop Time 0920   last 10 min on nustep   PT Time Calculation (min) 52 min    Activity Tolerance Patient tolerated treatment well    Behavior During Therapy [facility] for tasks assessed/performed             Past Medical History:  Diagnosis Date   Medical history non-contributory     Past Surgical History:  Procedure Laterality Date   NO PAST SURGERIES      There were no vitals filed for this visit.   Subjective Assessment - 02/20/21 0831     Subjective Pt stated she is feeling pretty good today, no reports of pain currenlty.    Patient Stated Goals Less pain, going up steps and hills easier    Currently in Pain? No/denies                [facility] PT Assessment - 02/20/21 0001       Assessment   Medical Diagnosis LT knee surgery    Referring Provider (PT) Duwayne Heck    Onset Date/Surgical Date 12/10/20    Next MD Visit 02/27/2021                           [facility] Adult PT Treatment/Exercise - 02/20/21 0001       Exercises   Exercises Knee/Hip      Knee/Hip Exercises: Stretches   Active Hamstring Stretch 3 reps;30 seconds    Active Hamstring Stretch Limitations 12in step height    Gastroc Stretch 3 reps;30 seconds    Gastroc Stretch Limitations slant board      Knee/Hip Exercises: Aerobic   Nustep Level 3 hills  x 10'      Knee/Hip Exercises: Machines for Strengthening   Total Gym Leg Press 3Pl 15x slow      Knee/Hip Exercises: Standing   Heel Raises 20 reps;5 seconds    Knee Flexion Strengthening;Left;15 reps    Knee Flexion Limitations 4#    Forward Lunges Both;15 reps    Forward Lunges Limitations no HHA\    Terminal Knee Extension Left;15 reps    Theraband Level (Terminal Knee Extension) Level 2 (Red)    Terminal Knee Extension Limitations 5" holds    Lateral Step Up Left;15 reps;Hand Hold: 0;Step Height: 6"    Forward Step Up Left;15 reps;Hand Hold: 0;Step Height: 6"    Step Down Left;10 reps;Hand Hold: 1;Step Height: 4"    Functional Squat 15 reps    SLS with Vectors 15" x 3 on foam    Other Standing Knee Exercises Sidestep 3RT RTB around thigh      Knee/Hip Exercises: Seated   Long Arc Quad 10 reps  Long Arc Quad Weight 10 lbs.    Long Arc Quad Limitations 3    Sit to [employer] 15 reps;without UE support   eccentric control                     PT Short Term Goals - 02/16/21 0847       PT SHORT TERM GOAL #1   Title PT to be I in HEP in order to improve hip and knee strength to allow pt to be able to arise from a low lying couch without difficulty    Time 2    Period Weeks    Status On-going    Target Date 02/27/21               PT Long Term Goals - 02/16/21 0848       PT LONG TERM GOAL #1   Title PT Lt LE strength to improve one grade to allow pt to go up and down 15 steps in a reciprocal manner without the use of handrails    Time 4    Period Weeks    Status On-going      PT LONG TERM GOAL #2   Title PT Lt knee pain to be no greater than a 2/10 to allow pt to complete a full day at work without difficulty.    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  Added step down for eccentric quad strengthening with visible musculature fatigue.  Pt tolerated well with no reports of increased pain through session.    Examination-Activity Limitations Lift;Locomotion Level;Stairs;Squat    Examination-Participation Restrictions Cleaning;Community Activity;Occupation;Yard Work    Stability/Clinical Decision Making Stable/Uncomplicated    Clinical Decision Making Moderate    Rehab Potential Good    PT Frequency 2x / week    PT Duration 4 weeks    PT Treatment/Interventions Patient/family education;Manual techniques;Balance training;Therapeutic exercise;Neuromuscular re-education;Therapeutic activities    PT Next Visit Plan Progress functional strengthening pt is at 10 weeks post op;  add fitter sliding board next session.    PT Home Exercise Plan Evaluation: squat, sit to stand, SL hip abduction, prone knee flexion and hip extension    Consulted and Agree with Plan of Care Patient             Patient will benefit from skilled therapeutic intervention in order to improve the following deficits and impairments:  Decreased activity tolerance, Decreased strength, Pain  Visit Diagnosis: Acute pain of left knee  Muscle weakness (generalized)     Problem List There are no problems to display for this patient.  Becky Sax, LPTA/CLT; CBIS [phone]  Juel Burrow 02/20/2021, 10:45 AM  [facility] [address] [place], [place], [address] Phone: [phone]   Fax:  [phone]  Name: Christy Guerrero MRN: [id number] Date of Birth: 06/05/1991

## 2021-02-25 ENCOUNTER — Encounter (HOSPITAL_COMMUNITY): Payer: Self-pay | Admitting: Physical Therapy

## 2021-02-25 ENCOUNTER — Other Ambulatory Visit: Payer: Self-pay

## 2021-02-25 ENCOUNTER — Ambulatory Visit (HOSPITAL_COMMUNITY): Payer: Medicaid Other | Admitting: Physical Therapy

## 2021-02-25 DIAGNOSIS — M25562 Pain in left knee: Secondary | ICD-10-CM

## 2021-02-25 DIAGNOSIS — M6281 Muscle weakness (generalized): Secondary | ICD-10-CM

## 2021-02-25 NOTE — Therapy (Signed)
[facility] [address] [place], [place], [address] Phone: [phone]   Fax:  [phone]  Physical Therapy Treatment  Patient Details  Name: Christy Guerrero MRN: [id number] Date of Birth: 08-04-1990 Referring Provider (PT): Duwayne Heck   Encounter Date: 02/25/2021   PT End of Session - 02/25/21 1615     Visit Number 4    Number of Visits 12    Date for PT Re-Evaluation 03/27/21    Authorization Type healthy blue kicks in when [facility] visits exhausted, UMR primary- VL -30 but clinical review at 25 visits= prior vists used in [place] - patient assumes 9 but thinks she did not use her benefits there - will follow up - until confirmed will use 30 as VL    Authorization - Visit Number 14    Authorization - Number of Visits 30    Progress Note Due on Visit 10    PT Start Time 1615    PT Stop Time 1655    PT Time Calculation (min) 40 min    Activity Tolerance Patient tolerated treatment well    Behavior During Therapy [facility] for tasks assessed/performed             Past Medical History:  Diagnosis Date   Medical history non-contributory     Past Surgical History:  Procedure Laterality Date   NO PAST SURGERIES      There were no vitals filed for this visit.   Subjective Assessment - 02/25/21 1620     Subjective Stats she was weed eating and stepped in a hole and her knee hyperextended and is swollen. States that her pain in her knee is in the front and medial side of her knee and is 4/10    Patient Stated Goals Less pain, going up steps and hills easier    Currently in Pain? Yes    Pain Score 4     Pain Location Knee    Pain Orientation Left    Pain Descriptors / Indicators Aching;Tightness    Pain Type Acute pain                [facility] PT Assessment - 02/25/21 0001       Assessment   Medical Diagnosis LT knee surgery    Referring Provider (PT) Duwayne Heck    Onset Date/Surgical Date 12/10/20    Next MD Visit 02/27/2021                            [facility] Adult PT Treatment/Exercise - 02/25/21 0001       Knee/Hip Exercises: Aerobic   Stationary Bike 4 minutes      Knee/Hip Exercises: Standing   Other Standing Knee Exercises hip hinges - fwd reach - 8 minutes of practice      Knee/Hip Exercises: Supine   Bridges AROM;Strengthening;2 sets;15 reps   5"     Manual Therapy   Manual Therapy Edema management;Joint mobilization    Manual therapy comments all manual interventions performed independently of other interventions    Edema Management to left knee with it elevated    Joint Mobilization patella mobilizations L in all directions as tolerated                    PT Education - 02/25/21 1639     Education Details on desensitization, elevation, HEP and ROM exercises.    Person(s) Educated Patient  Methods Explanation    Comprehension Verbalized understanding              PT Short Term Goals - 02/16/21 0847       PT SHORT TERM GOAL #1   Title PT to be I in HEP in order to improve hip and knee strength to allow pt to be able to arise from a low lying couch without difficulty    Time 2    Period Weeks    Status On-going    Target Date 02/27/21               PT Long Term Goals - 02/16/21 0848       PT LONG TERM GOAL #1   Title PT Lt LE strength to improve one grade to allow pt to go up and down 15 steps in a reciprocal manner without the use of handrails    Time 4    Period Weeks    Status On-going      PT LONG TERM GOAL #2   Title PT Lt knee pain to be no greater than a 2/10 to allow pt to complete a full day at work without difficulty.    Time 4    Period Weeks    Status On-going      PT LONG TERM GOAL #3   Title Pt to be able to single leg stance for at least 60 seconds to allow pt to walk with conduit on uneven terrain    Time 6    Period Weeks    Status On-going                   Plan - 02/25/21 1706     Clinical Impression  Statement Session focused on non-weightbearing exercises and low impact exercises secondary to recent increase in pain with stepping in hole in the yard. No increase in pain but stiffness in knee noted with ROM exercises initially. Instructed patient on desensitization exercises secondary to hyper-sensitivity along medial knee. Will follow up with patient next session about continued swelling and pain from mis-step today.    Examination-Activity Limitations Lift;Locomotion Level;Stairs;Squat    Examination-Participation Restrictions Cleaning;Community Activity;Occupation;Yard Work    Stability/Clinical Decision Making Stable/Uncomplicated    Rehab Potential Good    PT Frequency 2x / week    PT Duration 4 weeks    PT Treatment/Interventions Patient/family education;Manual techniques;Balance training;Therapeutic exercise;Neuromuscular re-education;Therapeutic activities    PT Next Visit Plan Progress functional strengthening pt is at 10 weeks post op;  add bosu squats and balance exercises once tolerated    PT Home Exercise Plan Evaluation: squat, sit to stand, SL hip abduction, prone knee flexion and hip extension; 8/31 bridge, hip hinge    Consulted and Agree with Plan of Care Patient             Patient will benefit from skilled therapeutic intervention in order to improve the following deficits and impairments:  Decreased activity tolerance, Decreased strength, Pain  Visit Diagnosis: Acute pain of left knee  Muscle weakness (generalized)     Problem List There are no problems to display for this patient.  5:07 PM, 02/25/21 Tereasa Coop, DPT Physical Therapy with [facility]  [phone] office   [facility] [address] [place], [place], [address] Phone: [phone]   Fax:  [phone]  Name: EMMALEAH MERONEY MRN: [id number] Date of Birth: 1991/03/22

## 2021-02-26 ENCOUNTER — Ambulatory Visit (HOSPITAL_COMMUNITY): Payer: Commercial Managed Care - PPO | Admitting: Physical Therapy

## 2021-03-03 ENCOUNTER — Encounter (HOSPITAL_COMMUNITY): Payer: Self-pay

## 2021-03-03 ENCOUNTER — Other Ambulatory Visit: Payer: Self-pay

## 2021-03-03 ENCOUNTER — Ambulatory Visit (HOSPITAL_COMMUNITY): Payer: Commercial Managed Care - PPO | Attending: Orthopedic Surgery

## 2021-03-03 DIAGNOSIS — M6281 Muscle weakness (generalized): Secondary | ICD-10-CM | POA: Insufficient documentation

## 2021-03-03 DIAGNOSIS — M25562 Pain in left knee: Secondary | ICD-10-CM | POA: Insufficient documentation

## 2021-03-03 NOTE — Therapy (Signed)
[facility] [address] [place], [place], [address] Phone: [phone]   Fax:  [phone]  Physical Therapy Treatment  Patient Details  Name: Christy Guerrero MRN: [id number] Date of Birth: March 09, 1991 Referring Provider (PT): Duwayne Heck   Encounter Date: 03/03/2021   PT End of Session - 03/03/21 1051     Visit Number 5    Number of Visits 12    Date for PT Re-Evaluation 03/27/21    Authorization Type healthy blue kicks in when [facility] visits exhausted, UMR primary- VL -30 but clinical review at 25 visits= prior vists used in [place] - patient assumes 9 but thinks she did not use her benefits there - will follow up - until confirmed will use 30 as VL    Authorization - Visit Number 15    Authorization - Number of Visits 30    Progress Note Due on Visit 10    PT Start Time 1047    PT Stop Time 1136   10' on Nustep at EOS   PT Time Calculation (min) 49 min    Activity Tolerance Patient tolerated treatment well    Behavior During Therapy [facility] for tasks assessed/performed             Past Medical History:  Diagnosis Date   Medical history non-contributory     Past Surgical History:  Procedure Laterality Date   NO PAST SURGERIES      There were no vitals filed for this visit.   Subjective Assessment - 03/03/21 1050     Subjective Knee is feeling good today, no reports of pain.  Stated most difficulty with controlled STS, squats, steps and hills.  Reports compliance wiht HEP.  RTW tomorrow, most concerned with climbing ladders.    Patient Stated Goals Less pain, going up steps and hills easier    Currently in Pain? No/denies                [facility] PT Assessment - 03/03/21 0001       Assessment   Medical Diagnosis LT knee surgery    Referring Provider (PT) Duwayne Heck    Onset Date/Surgical Date 12/10/20    Next MD Visit 2 months                           [facility] Adult PT Treatment/Exercise - 03/03/21  0001       Exercises   Exercises Knee/Hip      Knee/Hip Exercises: Aerobic   Nustep Level 4 hills x 10'      Knee/Hip Exercises: Machines for Strengthening   Total Gym Leg Press 3Pl 15x slow    Other Machine TKE retro gait "toe to heel" 2Pl      Knee/Hip Exercises: Standing   Heel Raises 20 reps;5 seconds    Heel Raises Limitations incline slope    Terminal Knee Extension Limitations Retro gait with 2Pl 10    Lateral Step Up Left;15 reps;Hand Hold: 0;Step Height: 6"    Forward Step Up Left;15 reps;Hand Hold: 0;Step Height: 6"    Forward Step Up Limitations power drive    Step Down FUXN;23 reps;Hand Hold: 1;Step Height: 6"    Functional Squat 2 sets;15 reps    Functional Squat Limitations on BOSU    SLS with Vectors 15" x 3 on foam    Other Standing Knee Exercises Sidestep 3RT RTB around thigh    Other Standing Knee Exercises  up/down ladder 3RT; lateral fitter x 1 min                      PT Short Term Goals - 02/16/21 0847       PT SHORT TERM GOAL #1   Title PT to be I in HEP in order to improve hip and knee strength to allow pt to be able to arise from a low lying couch without difficulty    Time 2    Period Weeks    Status On-going    Target Date 02/27/21               PT Long Term Goals - 02/16/21 0848       PT LONG TERM GOAL #1   Title PT Lt LE strength to improve one grade to allow pt to go up and down 15 steps in a reciprocal manner without the use of handrails    Time 4    Period Weeks    Status On-going      PT LONG TERM GOAL #2   Title PT Lt knee pain to be no greater than a 2/10 to allow pt to complete a full day at work without difficulty.    Time 4    Period Weeks    Status On-going      PT LONG TERM GOAL #3   Title Pt to be able to single leg stance for at least 60 seconds to allow pt to walk with conduit on uneven terrain    Time 6    Period Weeks    Status On-going                   Plan - 03/03/21 1300      Clinical Impression Statement Pt at 11 week post-op and progressing well toward POC.  Resumed weight bearing exercises wtih good control and tolerated well.  Added lateral fitter and began retro gait with TKE for knee stability.  Pt reported RTW tomorrow, reviewed mechanics wiht ladder to assess ease with some c/o knee tightness though able to go up and down safely.  Added BOSU for dynamic balalnce during functional squats, good mechanics following weight bearing instructions.  No reports of pain through session.    Examination-Activity Limitations Lift;Locomotion Level;Stairs;Squat    Examination-Participation Restrictions Cleaning;Community Activity;Occupation;Yard Work    Stability/Clinical Decision Making Stable/Uncomplicated    Clinical Decision Making Moderate    Rehab Potential Good    PT Frequency 2x / week    PT Duration 4 weeks    PT Treatment/Interventions Patient/family education;Manual techniques;Balance training;Therapeutic exercise;Neuromuscular re-education;Therapeutic activities    PT Next Visit Plan Progress functional strengthening pt is at 12 weeks post op;  continue bosu squats and balance exercises once tolerated    PT Home Exercise Plan Evaluation: squat, sit to stand, SL hip abduction, prone knee flexion and hip extension; 8/31 bridge, hip hinge    Consulted and Agree with Plan of Care Patient             Patient will benefit from skilled therapeutic intervention in order to improve the following deficits and impairments:  Decreased activity tolerance, Decreased strength, Pain  Visit Diagnosis: Acute pain of left knee  Muscle weakness (generalized)     Problem List There are no problems to display for this patient.  Becky Sax, LPTA/CLT; CBIS [phone]  Juel Burrow 03/03/2021, 1:05 PM  [facility] [address]  [address] [place], [place], [address] Phone: [phone]   Fax:  [phone]  Name: Christy Guerrero MRN: [id number] Date of Birth: 20-Aug-1990

## 2021-03-04 ENCOUNTER — Encounter (HOSPITAL_COMMUNITY): Payer: Self-pay

## 2021-03-04 ENCOUNTER — Ambulatory Visit (HOSPITAL_COMMUNITY): Payer: Commercial Managed Care - PPO

## 2021-03-04 DIAGNOSIS — M25562 Pain in left knee: Secondary | ICD-10-CM | POA: Diagnosis not present

## 2021-03-04 DIAGNOSIS — M6281 Muscle weakness (generalized): Secondary | ICD-10-CM

## 2021-03-04 NOTE — Therapy (Signed)
[facility] [address] [place], [place], [address] Phone: [phone]   Fax:  [phone]  Physical Therapy Treatment  Patient Details  Name: Christy Guerrero MRN: [id number] Date of Birth: October 20, 1990 Referring Provider (PT): Duwayne Heck   Encounter Date: 03/04/2021   PT End of Session - 03/04/21 1753     Visit Number 6    Number of Visits 12    Date for PT Re-Evaluation 03/27/21    Authorization Type healthy blue kicks in when [facility] visits exhausted, UMR primary- VL -30 but clinical review at 25 visits= prior vists used in [place] - patient assumes 9 but thinks she did not use her benefits there - will follow up - until confirmed will use 30 as VL    Authorization - Visit Number 16    Authorization - Number of Visits 30    Progress Note Due on Visit 10    PT Start Time 1747    PT Stop Time 1825    PT Time Calculation (min) 38 min    Activity Tolerance Patient tolerated treatment well    Behavior During Therapy [facility] for tasks assessed/performed             Past Medical History:  Diagnosis Date   Medical history non-contributory     Past Surgical History:  Procedure Laterality Date   NO PAST SURGERIES      There were no vitals filed for this visit.   Subjective Assessment - 03/04/21 1750     Subjective Returned to work today, stated difficulty with crawling and squatting, pain scale 5/10 soreness.    Patient Stated Goals Less pain, going up steps and hills easier    Currently in Pain? Yes    Pain Score 5     Pain Location Knee    Pain Orientation Left    Pain Descriptors / Indicators Aching;Sore    Pain Type Acute pain    Pain Onset More than a month ago    Pain Frequency Intermittent    Aggravating Factors  activity                [facility] PT Assessment - 03/04/21 0001       Assessment   Medical Diagnosis LT knee surgery    Referring Provider (PT) Duwayne Heck    Onset Date/Surgical Date 12/10/20    Next MD  Visit 2 months                           [facility] Adult PT Treatment/Exercise - 03/04/21 0001       Exercises   Exercises Knee/Hip      Knee/Hip Exercises: Stretches   Active Hamstring Stretch 3 reps;30 seconds    Active Hamstring Stretch Limitations 12in step height    Gastroc Stretch 3 reps;30 seconds    Gastroc Stretch Limitations slant board      Knee/Hip Exercises: Machines for Strengthening   Cybex Knee Flexion 5Pl 2x 10 slow    Total Gym Leg Press 4Pl 2x 10 slow    Other Machine TKE retro gait "toe to heel" 2Pl 10 reps      Knee/Hip Exercises: Standing   Heel Raises 20 reps;5 seconds    Heel Raises Limitations incline slope    Forward Lunges Both;15 reps    Forward Lunges Limitations no HHA    Terminal Knee Extension Limitations Retro gait with 2Pl 10    Functional  Squat 2 sets;15 reps    Functional Squat Limitations on BOSU    Stairs 5RT 7in step height    SLS with Vectors 15" x 3 on foam    Other Standing Knee Exercises Sidestep 3RT RTB around ankles      Knee/Hip Exercises: Seated   Long Arc Quad 10 reps    Sit to [employer] 10 reps;without UE support   eccentric control                      PT Short Term Goals - 02/16/21 0847       PT SHORT TERM GOAL #1   Title PT to be I in HEP in order to improve hip and knee strength to allow pt to be able to arise from a low lying couch without difficulty    Time 2    Period Weeks    Status On-going    Target Date 02/27/21               PT Long Term Goals - 02/16/21 0848       PT LONG TERM GOAL #1   Title PT Lt LE strength to improve one grade to allow pt to go up and down 15 steps in a reciprocal manner without the use of handrails    Time 4    Period Weeks    Status On-going      PT LONG TERM GOAL #2   Title PT Lt knee pain to be no greater than a 2/10 to allow pt to complete a full day at work without difficulty.    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  Began reciprocal pattern on 7in step height with min cueing to improve Rt knee flexion descending steps.  Able to complete squats on BOSU with good control and no HHA required today.  No reoprts of increased pain through session.  Reviewed RICE techniques to address soreness and edema control following first day of RTW.    Examination-Activity Limitations Lift;Locomotion Level;Stairs;Squat    Examination-Participation Restrictions Cleaning;Community Activity;Occupation;Yard Work    Stability/Clinical Decision Making Stable/Uncomplicated    Clinical Decision Making Moderate    Rehab Potential Good    PT Frequency 2x / week    PT Duration 4 weeks    PT Treatment/Interventions Patient/family education;Manual techniques;Balance training;Therapeutic exercise;Neuromuscular re-education;Therapeutic activities    PT Next Visit Plan Progress functional strengthening pt is at 12 weeks post op;  continue bosu squats and balance exercises once tolerated    PT Home Exercise Plan Evaluation: squat, sit to stand, SL hip abduction, prone knee flexion and hip extension; 8/31 bridge, hip hinge    Consulted and Agree with Plan of Care Patient             Patient will benefit from skilled therapeutic intervention in order to improve the following deficits and impairments:  Decreased activity tolerance, Decreased strength, Pain  Visit Diagnosis: Acute pain of left  knee  Muscle weakness (generalized)     Problem List There are no problems to display for this patient.  Becky Sax, LPTA/CLT; CBIS [phone]  Juel Burrow, PTA 03/04/2021, 6:30 PM  [facility] [address] [place], [place], [address] Phone: [phone]   Fax:  [phone]  Name: Christy Guerrero MRN: [id number] Date of Birth: December 11, 1990

## 2021-03-10 ENCOUNTER — Other Ambulatory Visit: Payer: Self-pay

## 2021-03-10 ENCOUNTER — Encounter (HOSPITAL_COMMUNITY): Payer: Self-pay

## 2021-03-10 ENCOUNTER — Ambulatory Visit (HOSPITAL_COMMUNITY): Payer: Commercial Managed Care - PPO

## 2021-03-10 DIAGNOSIS — M25562 Pain in left knee: Secondary | ICD-10-CM

## 2021-03-10 DIAGNOSIS — M6281 Muscle weakness (generalized): Secondary | ICD-10-CM

## 2021-03-10 NOTE — Therapy (Signed)
[facility] [address] [place], [place], [address] Phone: [phone]   Fax:  [phone]  Physical Therapy Treatment  Patient Details  Name: MORINE KOHLMAN MRN: [id number] Date of Birth: [birth year] Referring Provider (PT): Duwayne Heck   Encounter Date: 03/10/2021   PT End of Session - 03/10/21 1623     Visit Number 7    Number of Visits 12    Date for PT Re-Evaluation 03/27/21    Authorization Type healthy blue kicks in when [facility] visits exhausted, UMR primary- VL -30 but clinical review at 25 visits= prior vists used in [place] - patient assumes 9 but thinks she did not use her benefits there - will follow up - until confirmed will use 30 as VL    Authorization - Visit Number 17    Authorization - Number of Visits 30    Progress Note Due on Visit 10    PT Start Time 1619    PT Stop Time 1658    PT Time Calculation (min) 39 min    Activity Tolerance Patient tolerated treatment well    Behavior During Therapy [facility] for tasks assessed/performed             Past Medical History:  Diagnosis Date   Medical history non-contributory     Past Surgical History:  Procedure Laterality Date   NO PAST SURGERIES      There were no vitals filed for this visit.   Subjective Assessment - 03/10/21 1622     Subjective Reports she has done a lot of sitting/driving today wiht work, no reports of pain just stiffness.    Patient Stated Goals Less pain, going up steps and hills easier    Currently in Pain? No/denies    Pain Descriptors / Indicators Tightness                               [facility] Adult PT Treatment/Exercise - 03/10/21 0001       Exercises   Exercises Knee/Hip      Knee/Hip Exercises: Aerobic   Elliptical 3' L1      Knee/Hip Exercises: Machines for Strengthening   Cybex Knee Flexion 7Pl 2x 10 slow    Total Gym Leg Press 5x 2x 10 slow      Knee/Hip Exercises: Standing   Heel Raises 20 reps;5 seconds     Heel Raises Limitations SLS each LE    Forward Lunges 10 reps    Forward Lunges Limitations posterior lunge no HHA    Functional Squat 2 sets;15 reps    Functional Squat Limitations on BOSU    Stairs 5RT 7in step height reciprocal pattern    SLS with Vectors 15" x 3 on foam    Walking with Sports Cord lateral stepping slow control 2RT down long hallway    Other Standing Knee Exercises minisquat sidestep 3RT    Other Standing Knee Exercises 10x crawl out and stand up      Knee/Hip Exercises: Seated   Long Arc Quad 10 reps    Long Arc Quad Weight 8 lbs.   7.5#                      PT Short Term Goals - 02/16/21 0847       PT SHORT TERM GOAL #1   Title PT to be I in HEP in order to improve hip and  knee strength to allow pt to be able to arise from a low lying couch without difficulty    Time 2    Period Weeks    Status On-going    Target Date 02/27/21               PT Long Term Goals - 02/16/21 0848       PT LONG TERM GOAL #1   Title PT Lt LE strength to improve one grade to allow pt to go up and down 15 steps in a reciprocal manner without the use of handrails    Time 4    Period Weeks    Status On-going      PT LONG TERM GOAL #2   Title PT Lt knee pain to be no greater than a 2/10 to allow pt to complete a full day at work without difficulty.    Time 4    Period Weeks    Status On-going      PT LONG TERM GOAL #3   Title Pt to be able to single leg stance for at least 60 seconds to allow pt to walk with conduit on uneven terrain    Time 6    Period Weeks    Status On-going                   Plan - 03/10/21 1657     Clinical Impression Statement Pt at 12 week post-op and progressing well.  Increased challenge with therapeutic activities including SLS heel raises, posterior lunge, sport cord and changed aerobic to elliptical following reports of wishes to return to playing in yard with kids.  Minimal cueing required for technqiue  through session.  No reports of pain.    Examination-Activity Limitations Lift;Locomotion Level;Stairs;Squat    Examination-Participation Restrictions Cleaning;Community Activity;Occupation;Yard Work    Stability/Clinical Decision Making Stable/Uncomplicated    Clinical Decision Making Moderate    Rehab Potential Good    PT Frequency 2x / week    PT Duration 4 weeks    PT Treatment/Interventions Patient/family education;Manual techniques;Balance training;Therapeutic exercise;Neuromuscular re-education;Therapeutic activities    PT Next Visit Plan Begin quad machine, 1 rep max when appropriate.  Progress functional strengthening pt is at 12 weeks post op;  continue bosu squats and balance exercises once tolerated    PT Home Exercise Plan Evaluation: squat, sit to stand, SL hip abduction, prone knee flexion and hip extension; 8/31 bridge, hip hinge    Consulted and Agree with Plan of Care Patient             Patient will benefit from skilled therapeutic intervention in order to improve the following deficits and impairments:  Decreased activity tolerance, Decreased strength, Pain  Visit Diagnosis: Acute pain of left knee  Muscle weakness (generalized)     Problem List There are no problems to display for this patient.  Becky Sax, LPTA/CLT; CBIS [phone]  Juel Burrow, PTA 03/10/2021, 5:00 PM  [facility] [place] [facility] [address] [place], [place], [address] Phone: [phone]   Fax:  [phone]  Name: ROCKELL FAULKS MRN: [id number] Date of Birth: 1990/07/22

## 2021-03-13 ENCOUNTER — Encounter (HOSPITAL_COMMUNITY): Payer: Commercial Managed Care - PPO | Admitting: Physical Therapy

## 2021-03-13 ENCOUNTER — Telehealth (HOSPITAL_COMMUNITY): Payer: Self-pay | Admitting: Physical Therapy

## 2021-03-13 NOTE — Telephone Encounter (Signed)
NO show # 1 Called pt no answer.   PT has no further appointments scheduled.  Requested pt to call the front desk and get more appointments.  PT ready for 1 max rep on Quad 90-40 and hamstrings for comparison.  Virgina Organ, PT CLT [phone]

## 2023-01-31 IMAGING — DX DG FOOT COMPLETE 3+V*R*
3 series · 3 of 3 positions shown · non-contrast
Comparison: None.

CLINICAL DATA: Blunt trauma to the dorsal right foot

EXAM:
RIGHT FOOT COMPLETE - 3+ VIEW

[foot ap]
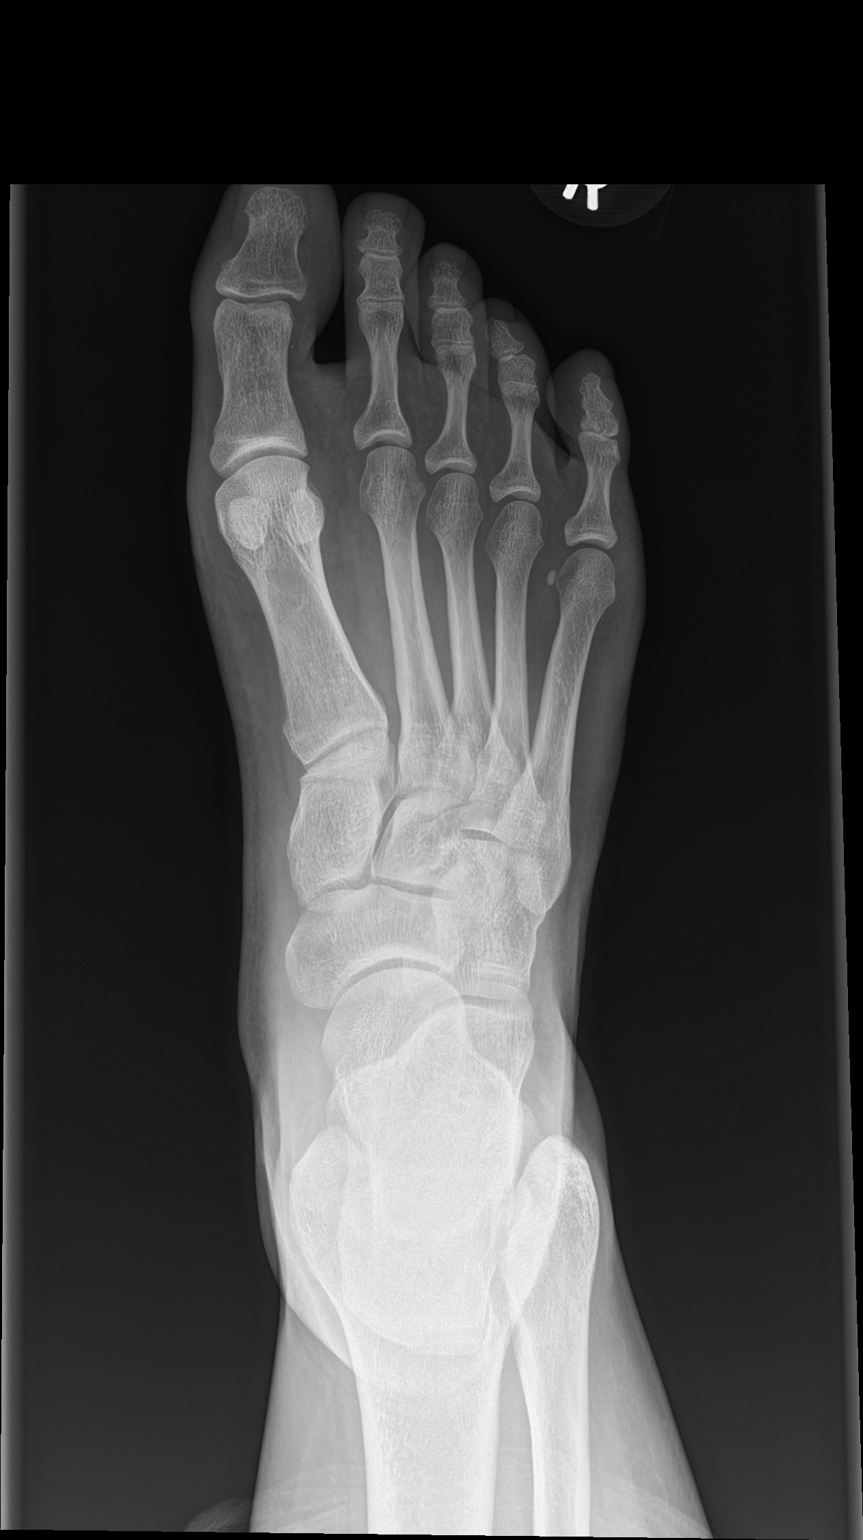

[foot obl]
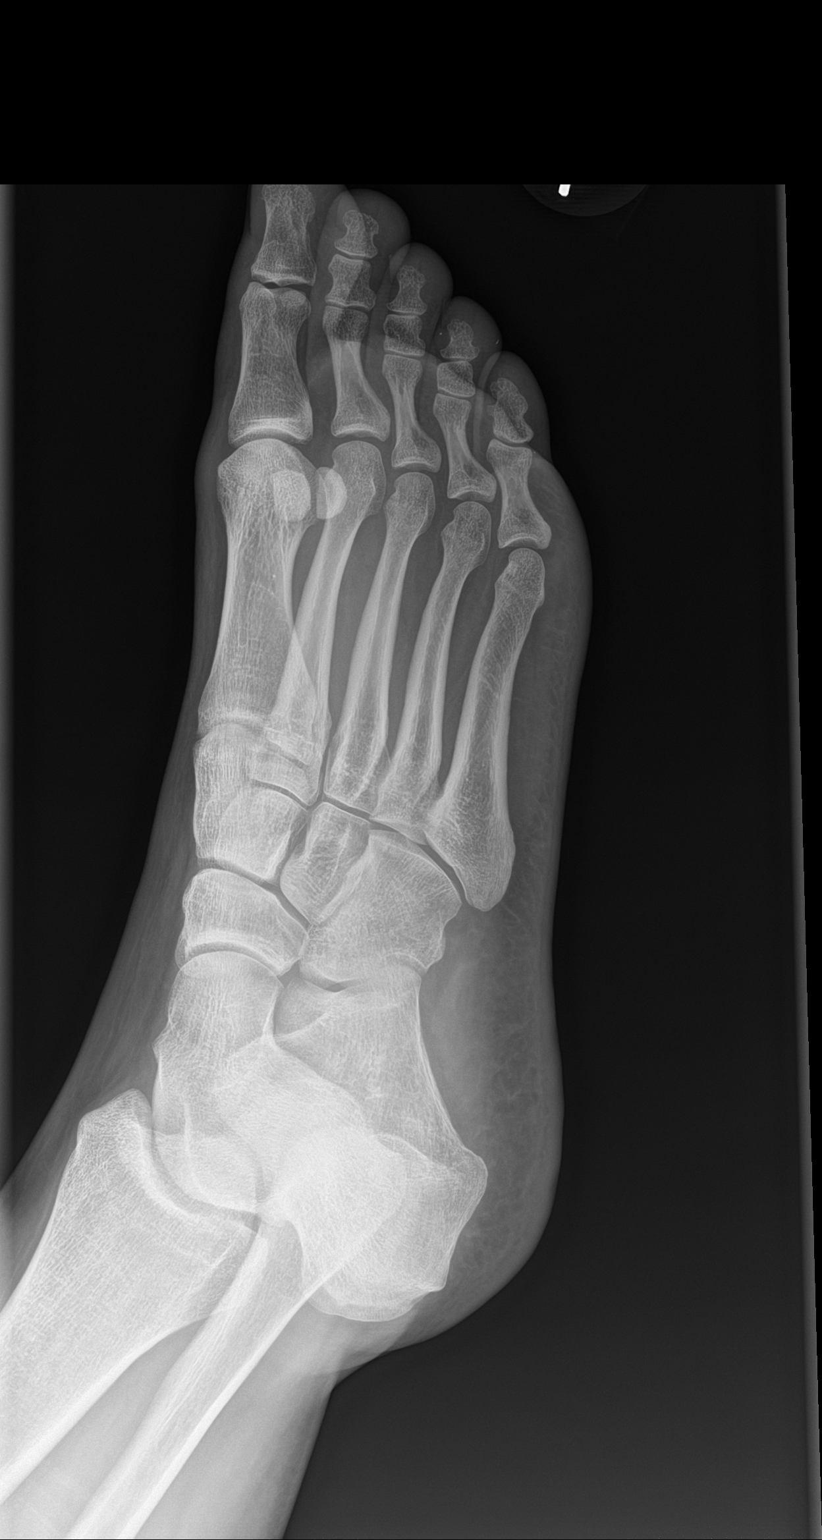

[foot lat]
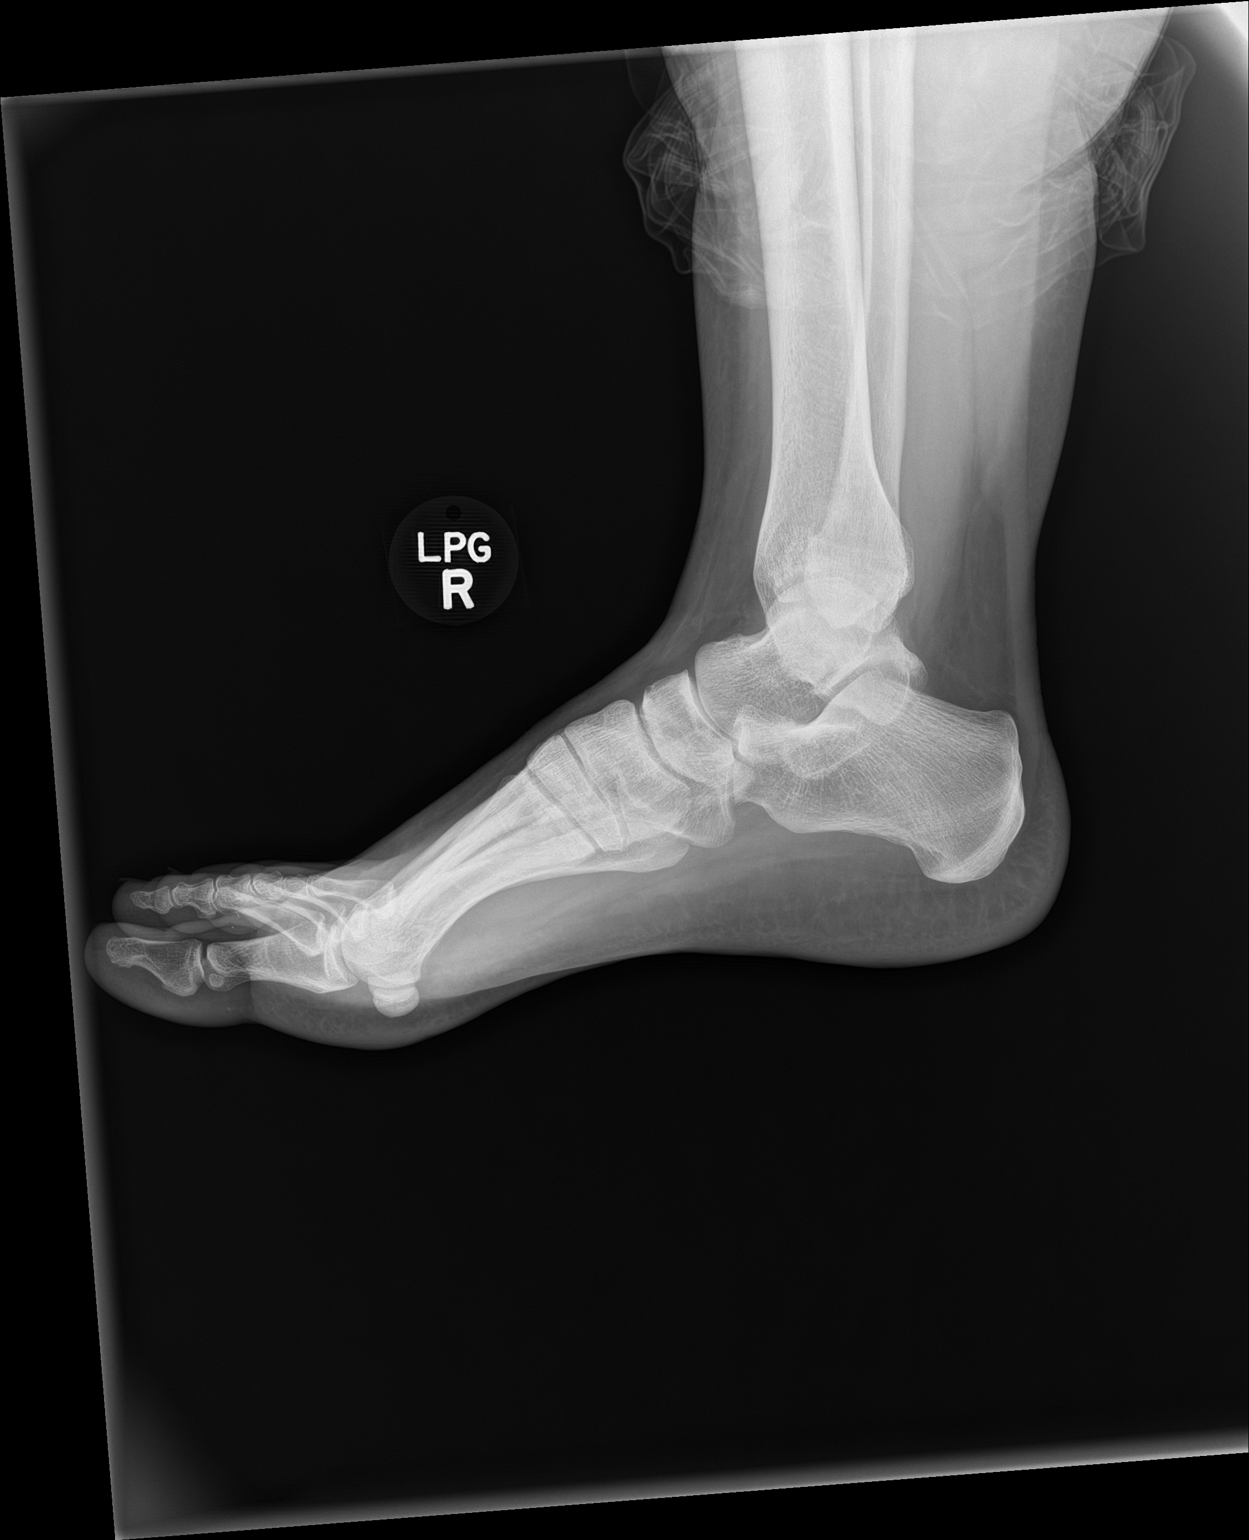

[3 of 3 positions shown; findings below may reference images not displayed]

FINDINGS: There is no evidence of fracture or dislocation. There is no
evidence of arthropathy or other focal bone abnormality. Soft
tissues are unremarkable.
IMPRESSION: Negative.

## 2023-08-15 ENCOUNTER — Inpatient Hospital Stay (HOSPITAL_COMMUNITY)
Admission: AD | Admit: 2023-08-15 | Discharge: 2023-08-15 | Disposition: A | Discharge status: Home / Self Care | Payer: Self-pay | Attending: Obstetrics and Gynecology | Admitting: Obstetrics and Gynecology

## 2023-08-15 ENCOUNTER — Encounter (HOSPITAL_COMMUNITY): Payer: Self-pay | Admitting: Obstetrics and Gynecology

## 2023-08-15 DIAGNOSIS — O209 Hemorrhage in early pregnancy, unspecified: Secondary | ICD-10-CM

## 2023-08-15 DIAGNOSIS — Z3A08 8 weeks gestation of pregnancy: Secondary | ICD-10-CM | POA: Insufficient documentation

## 2023-08-15 DIAGNOSIS — O26891 Other specified pregnancy related conditions, first trimester: Secondary | ICD-10-CM | POA: Insufficient documentation

## 2023-08-15 DIAGNOSIS — Z3A01 Less than 8 weeks gestation of pregnancy: Secondary | ICD-10-CM

## 2023-08-15 DIAGNOSIS — R109 Unspecified abdominal pain: Secondary | ICD-10-CM | POA: Insufficient documentation

## 2023-08-15 DIAGNOSIS — O26851 Spotting complicating pregnancy, first trimester: Secondary | ICD-10-CM | POA: Insufficient documentation

## 2023-08-15 LAB — URINALYSIS, ROUTINE W REFLEX MICROSCOPIC
Bilirubin Urine: NEGATIVE
Glucose, UA: NEGATIVE mg/dL
Hgb urine dipstick: NEGATIVE
Ketones, ur: NEGATIVE mg/dL
Leukocytes,Ua: NEGATIVE
Nitrite: NEGATIVE
Protein, ur: NEGATIVE mg/dL
Specific Gravity, Urine: 1.01 (ref 1.005–1.030)
pH: 6 (ref 5.0–8.0)

## 2023-08-15 LAB — POCT PREGNANCY, URINE: Preg Test, Ur: POSITIVE — AB

## 2023-08-15 NOTE — MAU Note (Addendum)
.  Christy Guerrero is a 33 y.o. at Unknown here in MAU reporting: Vaginal bleeding and intermittent lower abdominal cramping that began this past Friday into Saturday. She reports the bleeding was initially heavier and bright red on Friday. She reports on Saturday it turned to a darker color and decreased to spotting. She denies seeing any blood today but reports the cramps still remain. Denies IC. Denies vaginal itching and vaginal odors.   LMP: 06/20/2023 - reports 34 day cycles Onset of complaint: 08/12/2023 Pain score: 3/10 lower abdomen  Vitals:   08/15/23 1351  BP: 123/61  Pulse: 89  Temp: 97.8 F (36.6 C)  SpO2: 100%     FHT: n/a Lab orders placed from triage: POCT Preg

## 2023-08-15 NOTE — MAU Provider Note (Signed)
History     [id number]  Arrival date and time: 08/15/23 1315    Chief Complaint  Patient presents with   Vaginal Bleeding   Abdominal Pain     HPI Christy Guerrero is a 33 y.o. at [redacted]w[redacted]d by unsure LMP, who presents for vaginal bleeding.   Patient reports vaginal bleeding over the past three days, though none today Mostly spotting Some mild cramping No burning or pain with urination No vaginal discharge, itching, or other symptoms Sees OBGYN in [place]   O positive blood type in Care Everywhere from 01/19/2022  OB History     Gravida  5   Para  4   Term  4   Preterm      AB      Living  4      SAB      IAB      Ectopic      Multiple      Live Births  4           Past Medical History:  Diagnosis Date   Medical history non-contributory     Past Surgical History:  Procedure Laterality Date   NO PAST SURGERIES      Family History  Problem Relation Age of Onset   Drug abuse Mother    Cancer Maternal Grandmother    Cancer Maternal Grandfather    Cancer Paternal Grandmother    Cancer Paternal Grandfather     Social History   Socioeconomic History   Marital status: Married    Spouse name: Not on file   Number of children: Not on file   Years of education: Not on file   Highest education level: Not on file  Occupational History   Not on file  Tobacco Use   Smoking status: Every Day    Current packs/day: 1.00    Average packs/day: 1 pack/day for 12.0 years (12.0 ttl pk-yrs)    Types: Cigarettes   Smokeless tobacco: Never   Tobacco comments:    here today to discuss  Substance and Sexual Activity   Alcohol use: No   Drug use: No   Sexual activity: Not Currently    Birth control/protection: None  Other Topics Concern   Not on file  Social History Narrative   Not on file   Social Drivers of Health   Financial Resource Strain: Not on file  Food Insecurity: Not on file  Transportation Needs: Not on file  Physical Activity: Not on  file  Stress: Not on file  Social Connections: Not on file  Intimate Partner Violence: Not At Risk (08/27/2020)   Received from [facility]   Humiliation, Afraid, Rape, and Kick questionnaire    Fear of Current or Ex-Partner: No    Emotionally Abused: No    Physically Abused: No    Sexually Abused: No    Allergies  Allergen Reactions   Ceftriaxone Sodium In Dextrose     rochephin - allergic reaction    No current facility-administered medications on file prior to encounter.   No current outpatient medications on file prior to encounter.     ROS Pertinent positives and negative per HPI, all others reviewed and negative  Physical Exam   BP 123/61 (BP Location: Right Arm)   Pulse 89   Temp 97.8 F (36.6 C) (Oral)   Resp 16   Ht 5\' 6"  (1.676 m)   Wt 67.8 kg   LMP 06/20/2023 (Exact Date)   SpO2 100%  BMI 24.13 kg/m   Patient Vitals for the past 24 hrs:  BP Temp Temp src Pulse Resp SpO2 Height Weight  08/15/23 1351 123/61 97.8 F (36.6 C) Oral 89 16 100 % 5\' 6"  (1.676 m) 67.8 kg    Physical Exam Vitals reviewed.  Constitutional:      General: She is not in acute distress.    Appearance: She is well-developed. She is not diaphoretic.  Eyes:     General: No scleral icterus. Pulmonary:     Effort: Pulmonary effort is normal. No respiratory distress.  Abdominal:     General: There is no distension.     Palpations: Abdomen is soft.     Tenderness: There is no abdominal tenderness. There is no guarding or rebound.  Skin:    General: Skin is warm and dry.  Neurological:     Mental Status: She is alert.     Coordination: Coordination normal.      Cervical Exam    Bedside Ultrasound Pt informed that the ultrasound is considered a limited OB ultrasound and is not intended to be a complete ultrasound exam.  Patient also informed that the ultrasound is not being completed with the intent of assessing for fetal or placental anomalies or any pelvic  abnormalities.  Explained that the purpose of today's ultrasound is to assess for  viability.  Patient acknowledges the purpose of the exam and the limitations of the study.      My interpretation: First trimester findings: Intrauterine gestational sac seen: yes Gestational sac summary: fetal pole seen, yolk sac seen Fetal cardiac activity: present   Labs Results for orders placed or performed during the hospital encounter of 08/15/23 (from the past 24 hours)  Pregnancy, urine POC     Status: Abnormal   Collection Time: 08/15/23  1:55 PM  Result Value Ref Range   Preg Test, Ur POSITIVE (A) NEGATIVE    Imaging No results found.  MAU Course  Procedures Lab Orders         Urinalysis, Routine w reflex microscopic -Urine, Clean Catch         Pregnancy, urine POC    No orders of the defined types were placed in this encounter.  Imaging Orders  No imaging studies ordered today    MDM Moderate (Level 3-4)  Assessment and Plan  #Vaginal bleeding in pregnancy, first trimester #[redacted] weeks gestation of pregnancy US shows viable IUP. We discussed that vaginal bleeding in the first trimester is common, and that 80-90% of patients will go on to have a normal pregnancy with a live delivery. The remainder are at increased risk for miscarriage, unfortunately there are no known interventions to mitigate this risk. Blood type O positive, rhogam not indicated. We discussed return precautions including crescendo abdominal pain, heavy vaginal bleeding soaking >1 pad/hour, and fever.    Dispo: discharged to home in stable condition   [name], MD/MPH 08/15/23 2:18 PM  Allergies as of 08/15/2023       Reactions   Ceftriaxone Sodium In Dextrose    rochephin - allergic reaction        Medication List    You have not been prescribed any medications.

## 2023-08-15 NOTE — Discharge Instructions (Signed)
Your ultrasound shows a healthy pregnancy. We discussed that vaginal bleeding in the first trimester is common, and that 80-90% of patients will go on to have a normal pregnancy with a live delivery. The remainder are at increased risk for miscarriage, unfortunately there are no known interventions to mitigate this risk. We discussed return precautions including crescendo abdominal pain, heavy vaginal bleeding soaking >1 pad/hour, and fever.
